# Patient Record
Sex: Male | Born: 1945 | Race: White | Hispanic: No | Marital: Married | Smoking: Never smoker
Health system: Southern US, Community
[De-identification: ages and names within clinical notes are randomized; demographics above are authoritative.]

## PROBLEM LIST (undated history)

## (undated) DIAGNOSIS — K219 Gastro-esophageal reflux disease without esophagitis: Secondary | ICD-10-CM

## (undated) DIAGNOSIS — N35919 Unspecified urethral stricture, male, unspecified site: Secondary | ICD-10-CM

## (undated) DIAGNOSIS — Z8719 Personal history of other diseases of the digestive system: Secondary | ICD-10-CM

## (undated) DIAGNOSIS — F419 Anxiety disorder, unspecified: Secondary | ICD-10-CM

## (undated) DIAGNOSIS — Z973 Presence of spectacles and contact lenses: Secondary | ICD-10-CM

## (undated) DIAGNOSIS — J302 Other seasonal allergic rhinitis: Secondary | ICD-10-CM

## (undated) DIAGNOSIS — Z8711 Personal history of peptic ulcer disease: Secondary | ICD-10-CM

## (undated) HISTORY — PX: KNEE ARTHROSCOPY: SHX127

---

## 2000-05-26 ENCOUNTER — Encounter: Payer: Self-pay | Admitting: Emergency Medicine

## 2000-05-26 ENCOUNTER — Inpatient Hospital Stay (HOSPITAL_COMMUNITY): Admission: EM | Admit: 2000-05-26 | Discharge: 2000-05-29 | Payer: Self-pay | Admitting: Emergency Medicine

## 2000-10-23 ENCOUNTER — Encounter: Payer: Self-pay | Admitting: Emergency Medicine

## 2000-10-23 ENCOUNTER — Emergency Department (HOSPITAL_COMMUNITY): Admission: EM | Admit: 2000-10-23 | Discharge: 2000-10-23 | Payer: Self-pay | Admitting: Emergency Medicine

## 2000-10-23 ENCOUNTER — Encounter: Payer: Self-pay | Admitting: Neurosurgery

## 2003-04-07 ENCOUNTER — Emergency Department (HOSPITAL_COMMUNITY): Admission: EM | Admit: 2003-04-07 | Discharge: 2003-04-07 | Payer: Self-pay | Admitting: Emergency Medicine

## 2003-04-07 ENCOUNTER — Encounter: Payer: Self-pay | Admitting: Emergency Medicine

## 2013-06-23 ENCOUNTER — Encounter (HOSPITAL_COMMUNITY): Payer: Self-pay

## 2013-06-23 ENCOUNTER — Emergency Department (HOSPITAL_COMMUNITY)
Admission: EM | Admit: 2013-06-23 | Discharge: 2013-06-23 | Disposition: A | Discharge status: Home / Self Care | Payer: Managed Care, Other (non HMO) | Attending: Emergency Medicine | Admitting: Emergency Medicine

## 2013-06-23 DIAGNOSIS — S60559A Superficial foreign body of unspecified hand, initial encounter: Secondary | ICD-10-CM | POA: Insufficient documentation

## 2013-06-23 DIAGNOSIS — Z79899 Other long term (current) drug therapy: Secondary | ICD-10-CM | POA: Insufficient documentation

## 2013-06-23 DIAGNOSIS — Z23 Encounter for immunization: Secondary | ICD-10-CM | POA: Insufficient documentation

## 2013-06-23 DIAGNOSIS — S60551A Superficial foreign body of right hand, initial encounter: Secondary | ICD-10-CM

## 2013-06-23 DIAGNOSIS — T148XXA Other injury of unspecified body region, initial encounter: Secondary | ICD-10-CM

## 2013-06-23 DIAGNOSIS — Y939 Activity, unspecified: Secondary | ICD-10-CM | POA: Insufficient documentation

## 2013-06-23 DIAGNOSIS — Y929 Unspecified place or not applicable: Secondary | ICD-10-CM | POA: Insufficient documentation

## 2013-06-23 DIAGNOSIS — X58XXXA Exposure to other specified factors, initial encounter: Secondary | ICD-10-CM | POA: Insufficient documentation

## 2013-06-23 MED ORDER — CLINDAMYCIN HCL 150 MG PO CAPS: 450.0000 mg | ORAL_CAPSULE | Freq: Three times a day (TID) | ORAL | Status: DC

## 2013-06-23 MED ORDER — CLINDAMYCIN HCL 300 MG PO CAPS
600.0000 mg | ORAL_CAPSULE | Freq: Once | ORAL | Status: AC
  Administered 2013-06-23: 600 mg via ORAL
  Filled 2013-06-23 (×2): qty 1

## 2013-06-23 MED ORDER — TETANUS-DIPHTH-ACELL PERTUSSIS 5-2.5-18.5 LF-MCG/0.5 IM SUSP
0.5000 mL | Freq: Once | INTRAMUSCULAR | Status: AC
  Administered 2013-06-23: 0.5 mL via INTRAMUSCULAR
  Filled 2013-06-23: qty 0.5

## 2013-06-23 NOTE — ED Provider Notes (Signed)
History  This chart was scribed for non-physician practitioner working with Douglas Munch, MD by Douglas Taylor, ED scribe. This patient was seen in room WTR8/WTR8 and the patient's care was started at 4:20 PM.  CSN: [id number] Arrival date & time 06/23/13  1534   Chief Complaint  Patient presents with  . Foreign Body in Skin   The history is provided by the patient and medical records. No language interpreter was used.    HPI Comments: Douglas Taylor is a 67 y.o. male who presents to the Emergency Department complaining of a splinter in his right hand that happened yesterday. He states he got some of it out but there is still some remaining. Pt states he has not done anything to it other than trying to pull it out. Pt states it has gotten a little red. Pt states he went to an Urgent Care in [facility] yesterday and they couldn't do anything about it. Pt states he has not taken any antibiotics. Nothing makes it better or worse. He denies fever, chills, nausea, vomiting.    History reviewed. No pertinent past medical history. History reviewed. No pertinent past surgical history. History reviewed. No pertinent family history. History  Substance Use Topics  . Smoking status: Not on file  . Smokeless tobacco: Not on file  . Alcohol Use: Not on file    Review of Systems  Constitutional: Negative for fever.  Skin: Positive for color change and wound.       Splinter in hand  Neurological: Negative for weakness and numbness.  All other systems reviewed and are negative.    Allergies  Review of patient's allergies indicates no known allergies.  Home Medications   Current Outpatient Rx  Name  Route  Sig  Dispense  Refill  . ALPRAZolam (XANAX XR) 1 MG 24 hr tablet   Oral   Take 1.5 mg by mouth every morning.         . clindamycin (CLEOCIN) 150 MG capsule   Oral   Take 3 capsules (450 mg total) by mouth 3 (three) times daily.   90 capsule   0     BP 133/78  Pulse  83  Temp(Src) 98.4 F (36.9 C) (Oral)  Resp 15  SpO2 99%  Physical Exam  Nursing note and vitals reviewed. Constitutional: He is oriented to person, place, and time. He appears well-developed and well-nourished. No distress.  Awake, alert, nontoxic appearance  HENT:  Head: Normocephalic and atraumatic.  Mouth/Throat: Oropharynx is clear and moist. No oropharyngeal exudate.  Eyes: Conjunctivae are normal. No scleral icterus.  Neck: Normal range of motion. Neck supple.  Cardiovascular: Normal rate, regular rhythm, S1 normal, S2 normal, normal heart sounds and intact distal pulses.   No murmur heard. Pulses:      Radial pulses are 2+ on the right side, and 2+ on the left side.  Capillary refill < 3 sec Pulses intact  Pulmonary/Chest: Effort normal and breath sounds normal. No respiratory distress. He has no wheezes.  Musculoskeletal: Normal range of motion. He exhibits no edema.  Full ROM of all joints in the RUE  Neurological: He is alert and oriented to person, place, and time.  Speech is clear and goal oriented Moves extremities without ataxia Sensation intact Strength 5 out of 5 in the right upper M.D. including strong grip strength  Skin: Skin is warm and dry. He is not diaphoretic.  Dorsal side of right hand with 3 cm area of erythema and mild  induration with visible foreign body.  Mild streaking of the erythema into the dorsal aspect of the wrist Nontender to palpation.   Psychiatric: He has a normal mood and affect.    ED Course  FOREIGN BODY REMOVAL Date/Time: 06/23/2013 5:01 PM Performed by: Douglas Taylor Authorized by: Douglas Taylor Consent: Verbal consent obtained. Risks and benefits: risks, benefits and alternatives were discussed Consent given by: patient Patient understanding: patient states understanding of the procedure being performed Patient consent: the patient's understanding of the procedure matches consent given Procedure consent:  procedure consent matches procedure scheduled Relevant documents: relevant documents present and verified Site marked: the operative site was marked Required items: required blood products, implants, devices, and special equipment available Patient identity confirmed: verbally with patient and arm band Time out: Immediately prior to procedure a "time out" was called to verify the correct patient, procedure, equipment, support staff and site/side marked as required. Body area: skin General location: upper extremity Location details: right hand Anesthesia: local infiltration Local anesthetic: lidocaine 1% without epinephrine Anesthetic total: 3 ml Patient sedated: no Patient restrained: no Patient cooperative: yes Localization method: visualized Removal mechanism: scalpel and hemostat Dressing: antibiotic ointment and dressing applied Tendon involvement: none Depth: subcutaneous 1 objects recovered. Objects recovered: 2cm wood splinter Post-procedure assessment: foreign body removed Patient tolerance: Patient tolerated the procedure well with no immediate complications.   (including critical care time)  DIAGNOSTIC STUDIES: Oxygen Saturation is 99% on RA, normal by my interpretation.    COORDINATION OF CARE: 4:28 PM-Discussed treatment plan which includes removal of splinter with pt at bedside and pt agreed to plan. Advised pt to follow up with PCP. Also advised pt to come back to ED but the redness goes further up his arm.   Labs Reviewed - No data to display No results found. 1. Splinter in skin   2. Foreign body, hand, superficial, right, initial encounter     MDM  Douglas Taylor presents with splinter in the dorsum of his right hand.  Splinter removed after numbing with scalpel and hemostat. No complications. No evidence of persistent foreign body. Patient tetanus updated, given one dose of antibiotics here in the department. Patient discharged with oral antibiotics.  Discussed at length reasons to return to the emergency department including increasing redness , warmth or development of fever. Recommend wound check in 2 days by primary care physician  I have also discussed reasons to return immediately to the ER.  Patient expresses understanding and agrees with plan.  Dr. Jeraldine Loots was consulted, evaluated this patient with me and agrees with the plan.    I personally performed the services described in this documentation, which was scribed in my presence. The recorded information has been reviewed and is accurate.    Dahlia Client Waymond Meador, PA-C 06/23/13 1705

## 2013-06-23 NOTE — ED Notes (Signed)
He states he got a splinter in his right hand yesterday.  This area on distal post. Right hand is surrounded by erythema, with a streak of erythema beginning to travel up his arm.   A bit of the splinter is visible post. Right hand.

## 2013-06-24 NOTE — ED Provider Notes (Signed)
  This was a shared visit with a mid-level provided (NP or PA).  Throughout the patient's course I was available for consultation/collaboration.  I saw the ECG (if appropriate), relevant labs and studies - I agree with the interpretation.  On my exam the patient was in no distress.  There was a notable foreign body in the dorsum of his hand      Gerhard Munch, MD 06/24/13 [phone]

## 2015-03-30 ENCOUNTER — Ambulatory Visit: Payer: Self-pay | Admitting: General Surgery

## 2015-03-30 NOTE — H&P (Signed)
History of Present Illness Ralene Ok MD; 02/10/2015 2:15 PM) Patient words: RIH.  The patient is a 69 year old male who presents with an inguinal hernia. Patient is a 69 year old male who is referred by Dr. Virgina Jock for an evaluation of a right inguinal hernia. Patient states that approximately 3 weeks ago this began with pain. He states that this was reduced in the office and since then has had on and off pain. 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    Other Problems Elbert Ewings, CMA; 02/10/2015 2:02 PM) Anxiety Disorder  Past Surgical History Elbert Ewings, [place]; 02/10/2015 2:02 PM) Vasectomy  Diagnostic Studies History Elbert Ewings, [place]; 02/10/2015 2:02 PM) Colonoscopy 5-10 years ago  Allergies Elbert Ewings, CMA; 02/10/2015 2:03 PM) No Known Drug Allergies03/12/2014  Medication History Elbert Ewings, CMA; 02/10/2015 2:03 PM) ALPRAZolam (0.5MG  Tablet, Oral) Active. Medications Reconciled  Social History Elbert Ewings, [place]; 02/10/2015 2:02 PM) Alcohol use Remotely quit alcohol use. Illicit drug use Remotely quit drug use. Tobacco use Never smoker.  Family History Elbert Ewings, [place]; 02/10/2015 2:02 PM) Arthritis Father. Heart disease in male family member before age 19  Review of Systems Elbert Ewings CMA; 02/10/2015 2:02 PM) General Not Present- Appetite Loss, Chills, Fatigue, Fever, Night Sweats, Weight Gain and Weight Loss. Skin Not Present- Change in Wart/Mole, Dryness, Hives, Jaundice, New Lesions, Non-Healing Wounds, Rash and Ulcer. HEENT Not Present- Earache, Hearing Loss, Hoarseness, Nose Bleed, Oral Ulcers, Ringing in the Ears, Seasonal Allergies, Sinus Pain, Sore Throat, Visual Disturbances, Wears glasses/contact lenses and Yellow Eyes. Respiratory Not Present- Bloody sputum, Chronic Cough, Difficulty Breathing, Snoring and Wheezing. Gastrointestinal Not Present- Abdominal Pain, Bloating, Bloody Stool, Change  in Bowel Habits, Chronic diarrhea, Constipation, Difficulty Swallowing, Excessive gas, Gets full quickly at meals, Hemorrhoids, Indigestion, Nausea, Rectal Pain and Vomiting. Male Genitourinary Not Present- Blood in Urine, Change in Urinary Stream, Frequency, Impotence, Nocturia, Painful Urination, Urgency and Urine Leakage.   Vitals Elbert Ewings CMA; 02/10/2015 2:04 PM) 02/10/2015 2:03 PM Weight: 160 lb Height: 67in Body Surface Area: 1.85 m Body Mass Index: 25.06 kg/m Temp.: 96.15F(Temporal)  Pulse: 79 (Regular)  Resp.: 16 (Unlabored)  BP: 128/70 (Sitting, Left Arm, Standard)    Physical Exam Ralene Ok MD; 02/10/2015 2:16 PM) General Mental Status-Alert. General Appearance-Consistent with stated age. Hydration-Well hydrated. Voice-Normal.  Head and Neck Head-normocephalic, atraumatic with no lesions or palpable masses. Trachea-midline. Thyroid Gland Characteristics - normal size and consistency.  Chest and Lung Exam Chest and lung exam reveals -quiet, even and easy respiratory effort with no use of accessory muscles and on auscultation, normal breath sounds, no adventitious sounds and normal vocal resonance. Inspection Chest Wall - Normal. Back - normal.  Cardiovascular Cardiovascular examination reveals -normal heart sounds, regular rate and rhythm with no murmurs and normal pedal pulses bilaterally.  Abdomen Inspection Skin - Scar - no surgical scars. Hernias - Inguinal hernia - Right - Incarcerated and Reducible. Palpation/Percussion Normal exam - Soft, Non Tender, No Rebound tenderness, No Rigidity (guarding) and No hepatosplenomegaly. Auscultation Normal exam - Bowel sounds normal.    Assessment & Plan Ralene Ok MD; 02/10/2015 2:17 PM) RIGHT INGUINAL HERNIA (550.90  K40.90) Impression: 69 year old male with a right inguinal hernia, likely indirect. I discussed with the patient laparoscopic surgery repair. The patient will  like to wait until his employment allows time for him to recover. When he is ready for surgery he will call us back we can schedule  him at that time. 1. The patient will like to proceed to the operating room for laparoscopic right inguinal hernia repair with mesh. 2. All risks and benefits were discussed with the patient to generally include, but not limited to: infection, bleeding, damage to surrounding structures, acute and chronic nerve pain, and recurrence. Alternatives were offered and described. All questions were answered and the patient voiced understanding of the procedure and wishes to proceed at this point with hernia repair.

## 2015-03-30 NOTE — Progress Notes (Signed)
Please put orders in Epic surgery 04-10-15 pre op 04-02-15 Thanks

## 2015-04-01 NOTE — Patient Instructions (Addendum)
Douglas Taylor  04/01/2015   Your procedure is scheduled on: Friday 04/10/15  Report to [facility] Main  Entrance and follow signs to               [place] at 05:30 AM.  Call this number if you have problems the morning of surgery [phone]   Remember:  Do not eat food or drink liquids :After Midnight.    Take these medicines the morning of surgery with A SIP OF WATER: Xanax                               You may not have any metal on your body including hair pins and              piercings  Do not wear jewelry, make-up, lotions, powders or perfumes.             Do not wear nail polish.  Do not shave  48 hours prior to surgery.              Men may shave face and neck.   Do not bring valuables to the hospital. [name].  Contacts, dentures or bridgework may not be worn into surgery.  Leave suitcase in the car. After surgery it may be brought to your room.     Patients discharged the day of surgery will not be allowed to drive home.  Name and phone number of your driver: Douglas Taylor [phone]  Special Instructions: N/A              Please read over the following fact sheets you were given: _____________________________________________________________________             [facility] - Preparing for Surgery Before surgery, you can play an important role.  Because skin is not sterile, your skin needs to be as free of germs as possible.  You can reduce the number of germs on your skin by washing with CHG (chlorahexidine gluconate) soap before surgery.  CHG is an antiseptic cleaner which kills germs and bonds with the skin to continue killing germs even after washing. Please DO NOT use if you have an allergy to CHG or antibacterial soaps.  If your skin becomes reddened/irritated stop using the CHG and inform your nurse when you arrive at Short Stay. Do not shave (including legs and underarms) for at least  48 hours prior to the first CHG shower.  You may shave your face/neck. Please follow these instructions carefully:  1.  Shower with CHG Soap the night before surgery and the  morning of Surgery.  2.  If you choose to wash your hair, wash your hair first as usual with your  normal  shampoo.  3.  After you shampoo, rinse your hair and body thoroughly to remove the  shampoo.                           4.  Use CHG as you would any other liquid soap.  You can apply chg directly  to the skin and wash  Gently with a scrungie or clean washcloth.  5.  Apply the CHG Soap to your body ONLY FROM THE NECK DOWN.   Do not use on face/ open                           Wound or open sores. Avoid contact with eyes, ears mouth and genitals (private parts).                       Wash face,  Genitals (private parts) with your normal soap.             6.  Wash thoroughly, paying special attention to the area where your surgery  will be performed.  7.  Thoroughly rinse your body with warm water from the neck down.  8.  DO NOT shower/wash with your normal soap after using and rinsing off  the CHG Soap.                9.  Pat yourself dry with a clean towel.            10.  Wear clean pajamas.            11.  Place clean sheets on your bed the night of your first shower and do not  sleep with pets. Day of Surgery : Do not apply any lotions/deodorants the morning of surgery.  Please wear clean clothes to the hospital/surgery center.  FAILURE TO FOLLOW THESE INSTRUCTIONS MAY RESULT IN THE CANCELLATION OF YOUR SURGERY PATIENT SIGNATURE_________________________________  NURSE SIGNATURE__________________________________  ________________________________________________________________________

## 2015-04-02 ENCOUNTER — Encounter (HOSPITAL_COMMUNITY): Payer: Self-pay

## 2015-04-02 ENCOUNTER — Encounter (HOSPITAL_COMMUNITY)
Admission: RE | Admit: 2015-04-02 | Discharge: 2015-04-02 | Disposition: A | Discharge status: Home / Self Care | Payer: Managed Care, Other (non HMO) | Source: Ambulatory Visit | Attending: General Surgery | Admitting: General Surgery

## 2015-04-02 DIAGNOSIS — Z01818 Encounter for other preprocedural examination: Secondary | ICD-10-CM | POA: Diagnosis present

## 2015-04-02 HISTORY — DX: Anxiety disorder, unspecified: F41.9

## 2015-04-02 LAB — CBC
HEMATOCRIT: 42.5 % (ref 39.0–52.0)
HEMOGLOBIN: 14.4 g/dL (ref 13.0–17.0)
MCH: 30.8 pg (ref 26.0–34.0)
MCHC: 33.9 g/dL (ref 30.0–36.0)
MCV: 91 fL (ref 78.0–100.0)
Platelets: 253 10*3/uL (ref 150–400)
RBC: 4.67 MIL/uL (ref 4.22–5.81)
RDW: 13.3 % (ref 11.5–15.5)
WBC: 6.3 10*3/uL (ref 4.0–10.5)

## 2015-04-08 NOTE — Anesthesia Preprocedure Evaluation (Addendum)
Anesthesia Evaluation  Patient identified by MRN, date of birth, ID band Patient awake    Reviewed: Allergy & Precautions, NPO status , Patient's Chart, lab work & pertinent test results, reviewed documented beta blocker date and time   Airway Mallampati: II   Neck ROM: Full    Dental  (+) Teeth Intact, Dental Advisory Given   Pulmonary  breath sounds clear to auscultation        Cardiovascular negative cardio ROS  Rhythm:Regular     Neuro/Psych Anxiety negative neurological ROS     GI/Hepatic negative GI ROS, Neg liver ROS,   Endo/Other  negative endocrine ROS  Renal/GU negative Renal ROS     Musculoskeletal   Abdominal (+)  Abdomen: soft.    Peds  Hematology 14/42   Anesthesia Other Findings   Reproductive/Obstetrics                            Anesthesia Physical Anesthesia Plan  ASA: I  Anesthesia Plan: General   Post-op Pain Management:    Induction: Intravenous  Airway Management Planned: Oral ETT  Additional Equipment:   Intra-op Plan:   Post-operative Plan: Extubation in OR  Informed Consent: I have reviewed the patients History and Physical, chart, labs and discussed the procedure including the risks, benefits and alternatives for the proposed anesthesia with the patient or authorized representative who has indicated his/her understanding and acceptance.     Plan Discussed with:   Anesthesia Plan Comments:        Anesthesia Quick Evaluation

## 2015-04-10 ENCOUNTER — Ambulatory Visit (HOSPITAL_COMMUNITY): Payer: Managed Care, Other (non HMO) | Admitting: Anesthesiology

## 2015-04-10 ENCOUNTER — Encounter (HOSPITAL_COMMUNITY)
Admission: RE | Disposition: A | Discharge status: Home / Self Care | Payer: Self-pay | Source: Ambulatory Visit | Attending: General Surgery

## 2015-04-10 ENCOUNTER — Ambulatory Visit (HOSPITAL_COMMUNITY)
Admission: RE | Admit: 2015-04-10 | Discharge: 2015-04-10 | Disposition: A | Discharge status: Home / Self Care | Payer: Managed Care, Other (non HMO) | Source: Ambulatory Visit | Attending: General Surgery | Admitting: General Surgery

## 2015-04-10 ENCOUNTER — Encounter (HOSPITAL_COMMUNITY): Payer: Self-pay | Admitting: *Deleted

## 2015-04-10 DIAGNOSIS — Z9852 Vasectomy status: Secondary | ICD-10-CM | POA: Insufficient documentation

## 2015-04-10 DIAGNOSIS — F419 Anxiety disorder, unspecified: Secondary | ICD-10-CM | POA: Diagnosis not present

## 2015-04-10 DIAGNOSIS — K4090 Unilateral inguinal hernia, without obstruction or gangrene, not specified as recurrent: Secondary | ICD-10-CM | POA: Diagnosis present

## 2015-04-10 HISTORY — PX: INGUINAL HERNIA REPAIR: SHX194

## 2015-04-10 HISTORY — PX: INSERTION OF MESH: SHX5868

## 2015-04-10 SURGERY — REPAIR, HERNIA, INGUINAL, LAPAROSCOPIC
Anesthesia: General | Site: Groin | Laterality: Right

## 2015-04-10 MED ORDER — GLYCOPYRROLATE 0.2 MG/ML IJ SOLN
INTRAMUSCULAR | Status: DC | PRN
  Administered 2015-04-10: 0.6 mg via INTRAVENOUS

## 2015-04-10 MED ORDER — FENTANYL CITRATE (PF) 100 MCG/2ML IJ SOLN
INTRAMUSCULAR | Status: DC | PRN
  Administered 2015-04-10: 50 ug via INTRAVENOUS
  Administered 2015-04-10: 100 ug via INTRAVENOUS

## 2015-04-10 MED ORDER — CEFAZOLIN SODIUM-DEXTROSE 2-3 GM-% IV SOLR
2.0000 g | INTRAVENOUS | Status: AC
  Administered 2015-04-10: 2 g via INTRAVENOUS

## 2015-04-10 MED ORDER — LACTATED RINGERS IV SOLN: INTRAVENOUS | Status: DC

## 2015-04-10 MED ORDER — OXYCODONE HCL 5 MG PO TABS
5.0000 mg | ORAL_TABLET | ORAL | Status: DC | PRN
  Administered 2015-04-10: 5 mg via ORAL
  Filled 2015-04-10: qty 1

## 2015-04-10 MED ORDER — MIDAZOLAM HCL 2 MG/2ML IJ SOLN
INTRAMUSCULAR | Status: AC
  Filled 2015-04-10: qty 2

## 2015-04-10 MED ORDER — CHLORHEXIDINE GLUCONATE 4 % EX LIQD: 1.0000 "application " | Freq: Once | CUTANEOUS | Status: DC

## 2015-04-10 MED ORDER — CISATRACURIUM BESYLATE 20 MG/10ML IV SOLN
INTRAVENOUS | Status: AC
  Filled 2015-04-10: qty 10

## 2015-04-10 MED ORDER — DEXAMETHASONE SODIUM PHOSPHATE 10 MG/ML IJ SOLN
INTRAMUSCULAR | Status: DC | PRN
  Administered 2015-04-10: 10 mg via INTRAVENOUS

## 2015-04-10 MED ORDER — ONDANSETRON HCL 4 MG/2ML IJ SOLN
INTRAMUSCULAR | Status: AC
  Filled 2015-04-10: qty 2

## 2015-04-10 MED ORDER — CISATRACURIUM BESYLATE (PF) 10 MG/5ML IV SOLN
INTRAVENOUS | Status: DC | PRN
  Administered 2015-04-10: 10 mg via INTRAVENOUS
  Administered 2015-04-10: 2 mg via INTRAVENOUS

## 2015-04-10 MED ORDER — METOCLOPRAMIDE HCL 5 MG/ML IJ SOLN
INTRAMUSCULAR | Status: AC
  Filled 2015-04-10: qty 2

## 2015-04-10 MED ORDER — 0.9 % SODIUM CHLORIDE (POUR BTL) OPTIME
TOPICAL | Status: DC | PRN
  Administered 2015-04-10: 1000 mL

## 2015-04-10 MED ORDER — DEXAMETHASONE SODIUM PHOSPHATE 10 MG/ML IJ SOLN
INTRAMUSCULAR | Status: AC
  Filled 2015-04-10: qty 1

## 2015-04-10 MED ORDER — SODIUM CHLORIDE 0.9 % IJ SOLN: 3.0000 mL | INTRAMUSCULAR | Status: DC | PRN

## 2015-04-10 MED ORDER — PROMETHAZINE HCL 25 MG/ML IJ SOLN: 6.2500 mg | INTRAMUSCULAR | Status: DC | PRN

## 2015-04-10 MED ORDER — PROPOFOL 10 MG/ML IV BOLUS
INTRAVENOUS | Status: AC
  Filled 2015-04-10: qty 20

## 2015-04-10 MED ORDER — GLYCOPYRROLATE 0.2 MG/ML IJ SOLN
INTRAMUSCULAR | Status: AC
  Filled 2015-04-10: qty 3

## 2015-04-10 MED ORDER — SODIUM CHLORIDE 0.9 % IV SOLN: 250.0000 mL | INTRAVENOUS | Status: DC | PRN

## 2015-04-10 MED ORDER — ACETAMINOPHEN 325 MG PO TABS: 650.0000 mg | ORAL_TABLET | ORAL | Status: DC | PRN

## 2015-04-10 MED ORDER — NEOSTIGMINE METHYLSULFATE 10 MG/10ML IV SOLN
INTRAVENOUS | Status: DC | PRN
  Administered 2015-04-10: 4 mg via INTRAVENOUS

## 2015-04-10 MED ORDER — MIDAZOLAM HCL 5 MG/5ML IJ SOLN
INTRAMUSCULAR | Status: DC | PRN
Start: 2015-04-10 — End: 2015-04-10
  Administered 2015-04-10: 2 mg via INTRAVENOUS

## 2015-04-10 MED ORDER — OXYCODONE-ACETAMINOPHEN 5-325 MG PO TABS: 1.0000 | ORAL_TABLET | ORAL | Status: DC | PRN

## 2015-04-10 MED ORDER — BUPIVACAINE-EPINEPHRINE 0.25% -1:200000 IJ SOLN
INTRAMUSCULAR | Status: DC | PRN
  Administered 2015-04-10: 15 mL

## 2015-04-10 MED ORDER — FENTANYL CITRATE (PF) 250 MCG/5ML IJ SOLN
INTRAMUSCULAR | Status: AC
  Filled 2015-04-10: qty 5

## 2015-04-10 MED ORDER — SODIUM CHLORIDE 0.9 % IJ SOLN: 3.0000 mL | Freq: Two times a day (BID) | INTRAMUSCULAR | Status: DC

## 2015-04-10 MED ORDER — FENTANYL CITRATE (PF) 100 MCG/2ML IJ SOLN
25.0000 ug | INTRAMUSCULAR | Status: DC | PRN
  Administered 2015-04-10 (×2): 50 ug via INTRAVENOUS

## 2015-04-10 MED ORDER — METOCLOPRAMIDE HCL 5 MG/ML IJ SOLN
INTRAMUSCULAR | Status: DC | PRN
  Administered 2015-04-10: 10 mg via INTRAVENOUS

## 2015-04-10 MED ORDER — ACETAMINOPHEN 650 MG RE SUPP
650.0000 mg | RECTAL | Status: DC | PRN
  Filled 2015-04-10: qty 1

## 2015-04-10 MED ORDER — ONDANSETRON HCL 4 MG/2ML IJ SOLN
INTRAMUSCULAR | Status: DC | PRN
  Administered 2015-04-10: 4 mg via INTRAVENOUS

## 2015-04-10 MED ORDER — MEPERIDINE HCL 50 MG/ML IJ SOLN: 6.2500 mg | INTRAMUSCULAR | Status: DC | PRN

## 2015-04-10 MED ORDER — LACTATED RINGERS IV SOLN
INTRAVENOUS | Status: DC | PRN
  Administered 2015-04-10: 07:00:00 via INTRAVENOUS

## 2015-04-10 MED ORDER — FENTANYL CITRATE (PF) 100 MCG/2ML IJ SOLN
INTRAMUSCULAR | Status: AC
  Filled 2015-04-10: qty 2

## 2015-04-10 MED ORDER — CEFAZOLIN SODIUM-DEXTROSE 2-3 GM-% IV SOLR
INTRAVENOUS | Status: AC
  Filled 2015-04-10: qty 50

## 2015-04-10 MED ORDER — PROPOFOL 10 MG/ML IV BOLUS
INTRAVENOUS | Status: DC | PRN
Start: 2015-04-10 — End: 2015-04-10
  Administered 2015-04-10: 140 mg via INTRAVENOUS

## 2015-04-10 SURGICAL SUPPLY — 34 items
BAG URINE DRAINAGE (UROLOGICAL SUPPLIES) ×3 IMPLANT
BENZOIN TINCTURE PRP APPL 2/3 (GAUZE/BANDAGES/DRESSINGS) ×3 IMPLANT
CABLE HIGH FREQUENCY MONO STRZ (ELECTRODE) IMPLANT
CATH FOLEY 3WAY 30CC 16FR (CATHETERS) ×3 IMPLANT
CHLORAPREP W/TINT 26ML (MISCELLANEOUS) ×3 IMPLANT
CLOSURE WOUND 1/2 X4 (GAUZE/BANDAGES/DRESSINGS) ×1
DECANTER SPIKE VIAL GLASS SM (MISCELLANEOUS) IMPLANT
DRAPE LAPAROSCOPIC ABDOMINAL (DRAPES) ×3 IMPLANT
ELECT REM PT RETURN 9FT ADLT (ELECTROSURGICAL) ×3
ELECTRODE REM PT RTRN 9FT ADLT (ELECTROSURGICAL) ×1 IMPLANT
GAUZE SPONGE 2X2 8PLY STRL LF (GAUZE/BANDAGES/DRESSINGS) ×1 IMPLANT
GLOVE BIO SURGEON STRL SZ7.5 (GLOVE) ×3 IMPLANT
GLOVE BIOGEL PI IND STRL 7.0 (GLOVE) ×4 IMPLANT
GLOVE BIOGEL PI INDICATOR 7.0 (GLOVE) ×8
GLOVE SURG SS PI 6.5 STRL IVOR (GLOVE) ×6 IMPLANT
GOWN STRL REUS W/TWL XL LVL3 (GOWN DISPOSABLE) ×9 IMPLANT
KIT BASIN OR (CUSTOM PROCEDURE TRAY) ×3 IMPLANT
MESH 3DMAX 4X6 RT LRG (Mesh General) ×3 IMPLANT
RELOAD STAPLE HERNIA 4.0 BLUE (INSTRUMENTS) ×3 IMPLANT
SCISSORS LAP 5X35 DISP (ENDOMECHANICALS) IMPLANT
SET IRRIG TUBING LAPAROSCOPIC (IRRIGATION / IRRIGATOR) IMPLANT
SET IRRIG Y TYPE TUR BLADDER L (SET/KITS/TRAYS/PACK) ×3 IMPLANT
SOLUTION ANTI FOG 6CC (MISCELLANEOUS) IMPLANT
SPONGE GAUZE 2X2 STER 10/PKG (GAUZE/BANDAGES/DRESSINGS) ×2
STAPLER HERNIA 12 8.5 360D (INSTRUMENTS) ×3 IMPLANT
STRIP CLOSURE SKIN 1/2X4 (GAUZE/BANDAGES/DRESSINGS) ×2 IMPLANT
SUT MNCRL AB 4-0 PS2 18 (SUTURE) ×3 IMPLANT
TAPE CLOTH SURG 4X10 WHT LF (GAUZE/BANDAGES/DRESSINGS) ×3 IMPLANT
TOWEL OR 17X26 10 PK STRL BLUE (TOWEL DISPOSABLE) ×3 IMPLANT
TOWEL OR NON WOVEN STRL DISP B (DISPOSABLE) ×3 IMPLANT
TRAY FOLEY W/METER SILVER 14FR (SET/KITS/TRAYS/PACK) ×3 IMPLANT
TRAY LAPAROSCOPIC (CUSTOM PROCEDURE TRAY) ×3 IMPLANT
TROCAR CANNULA W/PORT DUAL 5MM (MISCELLANEOUS) ×3 IMPLANT
TROCAR XCEL 12X100 BLDLESS (ENDOMECHANICALS) ×3 IMPLANT

## 2015-04-10 NOTE — Discharge Instructions (Signed)
[facility] _______[facility] ° °INGUINAL HERNIA REPAIR: POST OP INSTRUCTIONS ° °Always review your discharge instruction sheet given to you by the facility where your surgery was performed. °IF YOU HAVE DISABILITY OR FAMILY LEAVE FORMS, YOU MUST BRING THEM TO THE OFFICE FOR PROCESSING.   °DO NOT GIVE THEM TO YOUR DOCTOR. ° °1. A  prescription for pain medication may be given to you upon discharge.  Take your pain medication as prescribed, if needed.  If narcotic pain medicine is not needed, then you may take acetaminophen (Tylenol) or ibuprofen (Advil) as needed. °2. Take your usually prescribed medications unless otherwise directed. °3. If you need a refill on your pain medication, please contact your pharmacy.  They will contact our office to request authorization. Prescriptions will not be filled after 5 pm or on week-ends. °4. You should follow a light diet the first 24 hours after arrival home, such as soup and crackers, etc.  Be sure to include lots of fluids daily.  Resume your normal diet the day after surgery. °5. Most patients will experience some swelling and bruising around the umbilicus or in the groin and scrotum.  Ice packs and reclining will help.  Swelling and bruising can take several days to resolve.  °6. It is common to experience some constipation if taking pain medication after surgery.  Increasing fluid intake and taking a stool softener (such as Colace) will usually help or prevent this problem from occurring.  A mild laxative (Milk of Magnesia or Miralax) should be taken according to package directions if there are no bowel movements after 48 hours. °7. Unless discharge instructions indicate otherwise, you may remove your bandages 24-48 hours after surgery, and you may shower at that time.  You may have steri-strips (small skin tapes) in place directly over the incision.  These strips should be left on the skin for 7-10 days.  If your surgeon used skin glue on the incision, you  may shower in 24 hours.  The glue will flake off over the next 2-3 weeks.  Any sutures or staples will be removed at the office during your follow-up visit. °8. ACTIVITIES:  You may resume regular (light) daily activities beginning the next day--such as daily self-care, walking, climbing stairs--gradually increasing activities as tolerated.  You may have sexual intercourse when it is comfortable.  Refrain from any heavy lifting or straining until approved by your doctor. °a. You may drive when you are no longer taking prescription pain medication, you can comfortably wear a seatbelt, and you can safely maneuver your car and apply brakes. °b. RETURN TO WORK:  __________________________________________________________ °9. You should see your doctor in the office for a follow-up appointment approximately 2-3 weeks after your surgery.  Make sure that you call for this appointment within a day or two after you arrive home to insure a convenient appointment time. °10. OTHER INSTRUCTIONS:  __________________________________________________________________________________________________________________________________________________________________________________________  °WHEN TO CALL YOUR DOCTOR: °1. Fever over 101.0 °2. Inability to urinate °3. Nausea and/or vomiting °4. Extreme swelling or bruising °5. Continued bleeding from incision. °6. Increased pain, redness, or drainage from the incision ° °The clinic staff is available to answer your questions during regular business hours.  Please don’t hesitate to call and ask to speak to one of the nurses for clinical concerns.  If you have a medical emergency, go to the nearest emergency room or call 911.  A surgeon from [facility] is always on call at the hospital ° ° °[address], [address], [place], [place]  [address] ? ° [address] [address], [place], [place]   [address] °[phone] ? [phone] ? FAX [phone] °Web site:  [email or web address] ° °

## 2015-04-10 NOTE — Interval H&P Note (Signed)
History and Physical Interval Note:  04/10/2015 7:20 AM  Douglas Taylor  has presented today for surgery, with the diagnosis of right inguinal hernia  The various methods of treatment have been discussed with the patient and family. After consideration of risks, benefits and other options for treatment, the patient has consented to  Procedure(s): LAPAROSCOPIC INGUINAL HERNIA REPAIR WITH MESH (Right) INSERTION OF MESH (Right) as a surgical intervention .  The patient's history has been reviewed, patient examined, no change in status, stable for surgery.  I have reviewed the patient's chart and labs.  Questions were answered to the patient's satisfaction.     Rosario Jacks., Anne Hahn

## 2015-04-10 NOTE — Transfer of Care (Signed)
Immediate Anesthesia Transfer of Care Note  Patient: Douglas Taylor  Procedure(s) Performed: Procedure(s): LAPAROSCOPIC INGUINAL HERNIA REPAIR WITH MESH (Right) INSERTION OF MESH (Right)  Patient Location: PACU  Anesthesia Type:General  Level of Consciousness: awake, sedated and patient cooperative  Airway & Oxygen Therapy: Patient Spontanous Breathing and Patient connected to face mask oxygen  Post-op Assessment: Report given to RN and Post -op Vital signs reviewed and stable  Post vital signs: Reviewed and stable  Last Vitals:  Filed Vitals:   04/10/15 0510  BP: 144/92  Pulse: 76  Temp: 36.3 C  Resp: 18    Complications: No apparent anesthesia complications

## 2015-04-10 NOTE — Anesthesia Postprocedure Evaluation (Signed)
  Anesthesia Post-op Note  Patient: Douglas Taylor  Procedure(s) Performed: Procedure(s): LAPAROSCOPIC INGUINAL HERNIA REPAIR WITH MESH (Right) INSERTION OF MESH (Right)  Patient Location: PACU  Anesthesia Type:General  Level of Consciousness: awake and alert   Airway and Oxygen Therapy: Patient Spontanous Breathing and Patient connected to nasal cannula oxygen  Post-op Pain: mild  Post-op Assessment: Post-op Vital signs reviewed, Patient's Cardiovascular Status Stable, Respiratory Function Stable, Patent Airway and No signs of Nausea or vomiting  Post-op Vital Signs: Reviewed and stable  Last Vitals:  Filed Vitals:   04/10/15 0848  BP: 147/91  Pulse:   Temp:   Resp:     Complications: No apparent anesthesia complications

## 2015-04-10 NOTE — H&P (View-Only) (Signed)
History of Present Illness Ralene Ok MD; 02/10/2015 2:15 PM) Patient words: RIH.  The patient is a 69 year old male who presents with an inguinal hernia. Patient is a 69 year old male who is referred by Dr. Virgina Jock for an evaluation of a right inguinal hernia. Patient states that approximately 3 weeks ago this began with pain. He states that this was reduced in the office and since then has had on and off pain. 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    Other Problems Elbert Ewings, CMA; 02/10/2015 2:02 PM) Anxiety Disorder  Past Surgical History Elbert Ewings, [place]; 02/10/2015 2:02 PM) Vasectomy  Diagnostic Studies History Elbert Ewings, [place]; 02/10/2015 2:02 PM) Colonoscopy 5-10 years ago  Allergies Elbert Ewings, CMA; 02/10/2015 2:03 PM) No Known Drug Allergies03/12/2014  Medication History Elbert Ewings, CMA; 02/10/2015 2:03 PM) ALPRAZolam (0.5MG  Tablet, Oral) Active. Medications Reconciled  Social History Elbert Ewings, [place]; 02/10/2015 2:02 PM) Alcohol use Remotely quit alcohol use. Illicit drug use Remotely quit drug use. Tobacco use Never smoker.  Family History Elbert Ewings, [place]; 02/10/2015 2:02 PM) Arthritis Father. Heart disease in male family member before age 21  Review of Systems Elbert Ewings CMA; 02/10/2015 2:02 PM) General Not Present- Appetite Loss, Chills, Fatigue, Fever, Night Sweats, Weight Gain and Weight Loss. Skin Not Present- Change in Wart/Mole, Dryness, Hives, Jaundice, New Lesions, Non-Healing Wounds, Rash and Ulcer. HEENT Not Present- Earache, Hearing Loss, Hoarseness, Nose Bleed, Oral Ulcers, Ringing in the Ears, Seasonal Allergies, Sinus Pain, Sore Throat, Visual Disturbances, Wears glasses/contact lenses and Yellow Eyes. Respiratory Not Present- Bloody sputum, Chronic Cough, Difficulty Breathing, Snoring and Wheezing. Gastrointestinal Not Present- Abdominal Pain, Bloating, Bloody Stool, Change  in Bowel Habits, Chronic diarrhea, Constipation, Difficulty Swallowing, Excessive gas, Gets full quickly at meals, Hemorrhoids, Indigestion, Nausea, Rectal Pain and Vomiting. Male Genitourinary Not Present- Blood in Urine, Change in Urinary Stream, Frequency, Impotence, Nocturia, Painful Urination, Urgency and Urine Leakage.   Vitals Elbert Ewings CMA; 02/10/2015 2:04 PM) 02/10/2015 2:03 PM Weight: 160 lb Height: 67in Body Surface Area: 1.85 m Body Mass Index: 25.06 kg/m Temp.: 96.58F(Temporal)  Pulse: 79 (Regular)  Resp.: 16 (Unlabored)  BP: 128/70 (Sitting, Left Arm, Standard)    Physical Exam Ralene Ok MD; 02/10/2015 2:16 PM) General Mental Status-Alert. General Appearance-Consistent with stated age. Hydration-Well hydrated. Voice-Normal.  Head and Neck Head-normocephalic, atraumatic with no lesions or palpable masses. Trachea-midline. Thyroid Gland Characteristics - normal size and consistency.  Chest and Lung Exam Chest and lung exam reveals -quiet, even and easy respiratory effort with no use of accessory muscles and on auscultation, normal breath sounds, no adventitious sounds and normal vocal resonance. Inspection Chest Wall - Normal. Back - normal.  Cardiovascular Cardiovascular examination reveals -normal heart sounds, regular rate and rhythm with no murmurs and normal pedal pulses bilaterally.  Abdomen Inspection Skin - Scar - no surgical scars. Hernias - Inguinal hernia - Right - Incarcerated and Reducible. Palpation/Percussion Normal exam - Soft, Non Tender, No Rebound tenderness, No Rigidity (guarding) and No hepatosplenomegaly. Auscultation Normal exam - Bowel sounds normal.    Assessment & Plan Ralene Ok MD; 02/10/2015 2:17 PM) RIGHT INGUINAL HERNIA (550.90  K40.90) Impression: 69 year old male with a right inguinal hernia, likely indirect. I discussed with the patient laparoscopic surgery repair. The patient will  like to wait until his employment allows time for him to recover. When he is ready for surgery he will call us back we can schedule  him at that time. 1. The patient will like to proceed to the operating room for laparoscopic right inguinal hernia repair with mesh. 2. All risks and benefits were discussed with the patient to generally include, but not limited to: infection, bleeding, damage to surrounding structures, acute and chronic nerve pain, and recurrence. Alternatives were offered and described. All questions were answered and the patient voiced understanding of the procedure and wishes to proceed at this point with hernia repair.

## 2015-04-10 NOTE — Anesthesia Procedure Notes (Signed)
Procedure Name: Intubation Date/Time: 04/10/2015 7:32 AM Performed by: Johnathan Hausen A Pre-anesthesia Checklist: Patient identified, Timeout performed, Emergency Drugs available, Suction available and Patient being monitored Patient Re-evaluated:Patient Re-evaluated prior to inductionOxygen Delivery Method: Circle system utilized Preoxygenation: Pre-oxygenation with 100% oxygen Intubation Type: Combination inhalational/ intravenous induction Ventilation: Mask ventilation without difficulty Laryngoscope Size: Mac and 4 Grade View: Grade I Tube type: Oral Tube size: 8.0 mm Number of attempts: 1 Airway Equipment and Method: Stylet Placement Confirmation: ETT inserted through vocal cords under direct vision,  positive ETCO2 and breath sounds checked- equal and bilateral Secured at: 20 cm Tube secured with: Tape Dental Injury: Teeth and Oropharynx as per pre-operative assessment

## 2015-04-10 NOTE — Op Note (Signed)
04/10/2015  8:34 AM  PATIENT:  Douglas Taylor  69 y.o. male  PRE-OPERATIVE DIAGNOSIS:  right inguinal hernia  POST-OPERATIVE DIAGNOSIS:  Right indirect inguinal hernia  PROCEDURE:  Procedure(s): LAPAROSCOPIC INGUINAL HERNIA REPAIR WITH MESH (Right) INSERTION OF MESH (Right)  SURGEON:  Surgeon(s) and Role:    * Ralene Ok, MD - Primary  PHYSICIAN ASSISTANT:   ASSISTANTS: none   ANESTHESIA:   local and general  EBL:   <5cc  BLOOD ADMINISTERED:none  DRAINS: none   LOCAL MEDICATIONS USED:  BUPIVICAINE   SPECIMEN:  No Specimen  DISPOSITION OF SPECIMEN:  N/A  COUNTS:  YES  TOURNIQUET:  * No tourniquets in log *  DICTATION: .Dragon Dictation   Counts: reported as correct x 2  Findings:  The patient had a small right indirect hernia  Indications for procedure:  The patient is a 69 year old male with a right hernia for several months. Patient complained of symptomatology to his right inguinal area. The patient was taken back for elective inguinal hernia repair.  Details of the procedure: The patient was taken back to the operating room. The patient was placed in supine position with bilateral SCDs in place.  The patient was prepped and draped in the usual sterile fashion.  After appropriate anitbiotics were confirmed, a time-out was confirmed and all facts were verified.  0.25% Marcaine was used to infiltrate the umbilical area. A 11-blade was used to cut down the skin and blunt dissection was used to get the anterior fashion.  The anterior fascia was incised approximately 1 cm and the muscles were retracted laterally. Blunt dissection was then used to create a space in the preperitoneal area. At this time a 10 mm camera was then introduced into the space and advanced the pubic tubercle and a 12 mm trocar was placed over this and insufflation was started.  At this time and space was created from medial to laterally the preperitoneal space.  Cooper's ligament was  initially cleaned off.  The hernia sac was identified in the right indirect space. Dissection of the hernia sac was undertaken the vas deferens was identified and protected in all parts of the case.  A large cord lipoma was also dissected from the inguinal canal.  This was removed from the preperitoneal space.  Once the hernia sac was taken down to approximately the umbilicus a Bard 3D Max mesh, size: Large, was  introduced into the preperitoneal space.  The mesh was brought over to cover the direct and indirect hernia spaces.  This was anchored into place and secured to Cooper's ligament with 4.5mm staples from a [place] hernia stapler. It was anchored to the anterior abdominal wall with 4.8 mm staples. The hernia sac was seen lying posterior to the mesh. There was no staples placed laterally. The insufflation was evacuated and the peritoneum was seen posterior to the mesh. The trochars were removed. The anterior fascia was reapproximated using #1 Vicryl on a UR- 6.  Intra-abdominal air was evacuated and the Veress needle removed. The skin was reapproximated using 4-0 Monocryl subcuticular fashion the patient was awakened from general anesthesia and taken to recovery in stable condition.   PLAN OF CARE: Discharge to home after PACU  PATIENT DISPOSITION:  PACU - hemodynamically stable.   Delay start of Pharmacological VTE agent (>24hrs) due to surgical blood loss or risk of bleeding: not applicable

## 2015-04-13 ENCOUNTER — Encounter (HOSPITAL_COMMUNITY): Payer: Self-pay | Admitting: General Surgery

## 2015-04-21 ENCOUNTER — Encounter (HOSPITAL_COMMUNITY): Payer: Self-pay | Admitting: Emergency Medicine

## 2015-04-21 ENCOUNTER — Emergency Department (HOSPITAL_COMMUNITY)
Admission: EM | Admit: 2015-04-21 | Discharge: 2015-04-21 | Disposition: A | Discharge status: Home / Self Care | Payer: Managed Care, Other (non HMO) | Attending: Emergency Medicine | Admitting: Emergency Medicine

## 2015-04-21 ENCOUNTER — Encounter (HOSPITAL_BASED_OUTPATIENT_CLINIC_OR_DEPARTMENT_OTHER): Payer: Self-pay | Admitting: Emergency Medicine

## 2015-04-21 DIAGNOSIS — N359 Urethral stricture, unspecified: Secondary | ICD-10-CM | POA: Diagnosis not present

## 2015-04-21 DIAGNOSIS — R339 Retention of urine, unspecified: Secondary | ICD-10-CM | POA: Diagnosis present

## 2015-04-21 DIAGNOSIS — F419 Anxiety disorder, unspecified: Secondary | ICD-10-CM | POA: Diagnosis not present

## 2015-04-21 DIAGNOSIS — Z79899 Other long term (current) drug therapy: Secondary | ICD-10-CM | POA: Diagnosis not present

## 2015-04-21 DIAGNOSIS — Z8719 Personal history of other diseases of the digestive system: Secondary | ICD-10-CM | POA: Diagnosis not present

## 2015-04-21 DIAGNOSIS — Z9049 Acquired absence of other specified parts of digestive tract: Secondary | ICD-10-CM | POA: Diagnosis not present

## 2015-04-21 DIAGNOSIS — N390 Urinary tract infection, site not specified: Secondary | ICD-10-CM | POA: Diagnosis not present

## 2015-04-21 LAB — CBC WITH DIFFERENTIAL/PLATELET
Basophils Absolute: 0 10*3/uL (ref 0.0–0.1)
Basophils Relative: 0 % (ref 0–1)
Eosinophils Absolute: 0.2 10*3/uL (ref 0.0–0.7)
Eosinophils Relative: 3 % (ref 0–5)
HCT: 41.9 % (ref 39.0–52.0)
Hemoglobin: 13.9 g/dL (ref 13.0–17.0)
LYMPHS PCT: 16 % (ref 12–46)
Lymphs Abs: 1.4 10*3/uL (ref 0.7–4.0)
MCH: 30.2 pg (ref 26.0–34.0)
MCHC: 33.2 g/dL (ref 30.0–36.0)
MCV: 91.1 fL (ref 78.0–100.0)
MONOS PCT: 10 % (ref 3–12)
Monocytes Absolute: 1 10*3/uL (ref 0.1–1.0)
Neutro Abs: 6.7 10*3/uL (ref 1.7–7.7)
Neutrophils Relative %: 71 % (ref 43–77)
Platelets: 249 10*3/uL (ref 150–400)
RBC: 4.6 MIL/uL (ref 4.22–5.81)
RDW: 13.3 % (ref 11.5–15.5)
WBC: 9.3 10*3/uL (ref 4.0–10.5)

## 2015-04-21 LAB — URINALYSIS, ROUTINE W REFLEX MICROSCOPIC
Bilirubin Urine: NEGATIVE
GLUCOSE, UA: NEGATIVE mg/dL
Ketones, ur: NEGATIVE mg/dL
Nitrite: POSITIVE — AB
PH: 7 (ref 5.0–8.0)
PROTEIN: NEGATIVE mg/dL
Specific Gravity, Urine: 1.008 (ref 1.005–1.030)
Urobilinogen, UA: 0.2 mg/dL (ref 0.0–1.0)

## 2015-04-21 LAB — BASIC METABOLIC PANEL
Anion gap: 10 (ref 5–15)
BUN: 7 mg/dL (ref 6–20)
CO2: 28 mmol/L (ref 22–32)
CREATININE: 0.88 mg/dL (ref 0.61–1.24)
Calcium: 9.3 mg/dL (ref 8.9–10.3)
Chloride: 104 mmol/L (ref 101–111)
Glucose, Bld: 109 mg/dL — ABNORMAL HIGH (ref 70–99)
POTASSIUM: 4.2 mmol/L (ref 3.5–5.1)
SODIUM: 142 mmol/L (ref 135–145)

## 2015-04-21 LAB — URINE MICROSCOPIC-ADD ON

## 2015-04-21 MED ORDER — LIDOCAINE HCL 2 % EX GEL
1.0000 "application " | Freq: Once | CUTANEOUS | Status: AC
  Administered 2015-04-21: 1 via URETHRAL
  Filled 2015-04-21: qty 10

## 2015-04-21 MED ORDER — HYDROCODONE-ACETAMINOPHEN 5-325 MG PO TABS
2.0000 | ORAL_TABLET | Freq: Once | ORAL | Status: AC
  Administered 2015-04-21: 2 via ORAL
  Filled 2015-04-21: qty 2

## 2015-04-21 MED ORDER — HYDROCODONE-ACETAMINOPHEN 5-325 MG PO TABS: 1.0000 | ORAL_TABLET | ORAL | Status: DC | PRN

## 2015-04-21 MED ORDER — CIPROFLOXACIN HCL 500 MG PO TABS
500.0000 mg | ORAL_TABLET | Freq: Once | ORAL | Status: AC
  Administered 2015-04-21: 500 mg via ORAL
  Filled 2015-04-21: qty 1

## 2015-04-21 MED ORDER — CIPROFLOXACIN HCL 500 MG PO TABS: 500.0000 mg | ORAL_TABLET | Freq: Two times a day (BID) | ORAL | Status: DC

## 2015-04-21 NOTE — ED Notes (Signed)
Pt states he had a hernia operation on 4/29th. Pt states he's had trouble urinating since, saw his PCP yesterday. States he didn't have any infection, and got some pills for his prostate. Says he took the pills, but this morning he hasn't been able to urinate at all and now is in a lot of pain.

## 2015-04-21 NOTE — Consult Note (Signed)
Reason for Consult: Urinary Retention, Urethral Stricture  Referring Physician: Pryor Curia, DO  Douglas Taylor is an 69 y.o. male.   HPI:   1 - Urinary Retention - pt with new urinary retention likely due to stricture below on eval weak strea, urinary urgency / freqeuncy that has been progressive for past 2 weeks.  Minimal baseline voiding complaints. NO recetn GU infections / fevers. Denies current pain med / anticholinergic use or new LE weakness.  PVR in ER today 600cc with lower abdominal pain.   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 Exam and catheter placment c/w pendulous stricture, mostly distal. 75F catheter placed.  PMH sig for appy, inguinal hernia repair.   Today Douglas Taylor is seen as urgent consult for urinary retention as he is in pain with full bladder and catheter unable to be placed.   Past Medical History  Diagnosis Date  . Anxiety   . Inguinal hernia     Past Surgical History  Procedure Laterality Date  . Appendectomy    . Inguinal hernia repair Right 04/10/2015    Procedure: LAPAROSCOPIC INGUINAL HERNIA REPAIR WITH MESH;  Surgeon: Ralene Ok, MD;  Location: [facility];  Service: General;  Laterality: Right;  . Insertion of mesh Right 04/10/2015    Procedure: INSERTION OF MESH;  Surgeon: Ralene Ok, MD;  Location: [facility];  Service: General;  Laterality: Right;    History reviewed. No pertinent family history.  Social History:  reports that he has never smoked. He does not have any smokeless tobacco history on file. He reports that he does not drink alcohol or use illicit drugs.  Allergies: No Known Allergies  Medications: I have reviewed the patient's current medications.  Results for orders placed or performed during the hospital encounter of 04/21/15 (from the past 48 hour(s))  CBC with Differential     Status: None   Collection Time: 04/21/15  2:10 PM  Result Value Ref Range   WBC 9.3  4.0 - 10.5 K/uL   RBC 4.60 4.22 - 5.81 MIL/uL   Hemoglobin 13.9 13.0 - 17.0 g/dL   HCT 41.9 39.0 - 52.0 %   MCV 91.1 78.0 - 100.0 fL   MCH 30.2 26.0 - 34.0 pg   MCHC 33.2 30.0 - 36.0 g/dL   RDW 13.3 11.5 - 15.5 %   Platelets 249 150 - 400 K/uL   Neutrophils Relative % 71 43 - 77 %   Neutro Abs 6.7 1.7 - 7.7 K/uL   Lymphocytes Relative 16 12 - 46 %   Lymphs Abs 1.4 0.7 - 4.0 K/uL   Monocytes Relative 10 3 - 12 %   Monocytes Absolute 1.0 0.1 - 1.0 K/uL   Eosinophils Relative 3 0 - 5 %   Eosinophils Absolute 0.2 0.0 - 0.7 K/uL   Basophils Relative 0 0 - 1 %   Basophils Absolute 0.0 0.0 - 0.1 K/uL  Basic metabolic panel     Status: Abnormal   Collection Time: 04/21/15  2:10 PM  Result Value Ref Range   Sodium 142 135 - 145 mmol/L   Potassium 4.2 3.5 - 5.1 mmol/L   Chloride 104 101 - 111 mmol/L   CO2 28 22 - 32 mmol/L   Glucose, Bld 109 (H) 70 - 99 mg/dL   BUN 7 6 - 20 mg/dL   Creatinine, Ser 0.88 0.61 - 1.24 mg/dL   Calcium 9.3 8.9 - 10.3 mg/dL  GFR calc non Af Amer >60 >60 mL/min   GFR calc Af Amer >60 >60 mL/min    Comment: (NOTE) The eGFR has been calculated using the CKD EPI equation. This calculation has not been validated in all clinical situations. eGFR's persistently <60 mL/min signify possible Chronic Kidney Disease.    Anion gap 10 5 - 15  Urinalysis, Routine w reflex microscopic     Status: Abnormal   Collection Time: 04/21/15  3:08 PM  Result Value Ref Range   Color, Urine YELLOW YELLOW   APPearance CLOUDY (A) CLEAR   Specific Gravity, Urine 1.008 1.005 - 1.030   pH 7.0 5.0 - 8.0   Glucose, UA NEGATIVE NEGATIVE mg/dL   Hgb urine dipstick TRACE (A) NEGATIVE   Bilirubin Urine NEGATIVE NEGATIVE   Ketones, ur NEGATIVE NEGATIVE mg/dL   Protein, ur NEGATIVE NEGATIVE mg/dL   Urobilinogen, UA 0.2 0.0 - 1.0 mg/dL   Nitrite POSITIVE (A) NEGATIVE   Leukocytes, UA SMALL (A) NEGATIVE  Urine microscopic-add on     Status: Abnormal   Collection Time: 04/21/15  3:08 PM   Result Value Ref Range   WBC, UA 11-20 <3 WBC/hpf   RBC / HPF 0-2 <3 RBC/hpf   Bacteria, UA FEW (A) RARE    No results found.  Review of Systems  Constitutional: Negative.   HENT: Negative.   Eyes: Negative.   Respiratory: Negative.   Cardiovascular: Negative.   Gastrointestinal: Negative.   Genitourinary: Positive for urgency and frequency.       Slowing stream progressively x 1 week.  Musculoskeletal: Negative.   Skin: Negative.   Neurological: Negative.   Endo/Heme/Allergies: Negative.   Psychiatric/Behavioral: Negative.    Blood pressure 134/88, pulse 83, temperature 97.6 F (36.4 C), temperature source Oral, resp. rate 16, SpO2 100 %. Physical Exam  Constitutional: He appears well-developed.  HENT:  Head: Normocephalic.  Pt wearing glasses  Eyes: Pupils are equal, round, and reactive to light.  Neck: Normal range of motion.  Cardiovascular: Normal rate.   Respiratory: Effort normal.  GI:  Recent laparoscopic port sites c/d/i.   Genitourinary:  Moderate shaft edema.   Musculoskeletal: Normal range of motion.  Neurological: He is alert.  Skin: Skin is warm.  Psychiatric: He has a normal mood and affect. His behavior is normal. Judgment and thought content normal.  Pt quite anxious   Using aseptic technique with betadine prep, A single and succesfull attempt was made at passage of 46F foley. Significant resistance distally c/w distal stricture. 10cc sterile water in balloon. Immediate efflux 600cc clear urine.   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 Not favored to be medication related as is typical of most peri-op retention episodes.   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 Now s/p dilation to 46F. Rec current catheter to stay and then trial of void in Urol office in about 2 weeks. Discussed natural history with at least 50% recurrence rate with dilation / catheter  alone.  We will arrange GU follow up.   Douglas Taylor 04/21/2015, 5:17 PM

## 2015-04-21 NOTE — ED Provider Notes (Signed)
TIME SEEN: 1:20 PM  CHIEF COMPLAINT: Urinary retention  HPI: Pt is a 69 y.o. male who recently underwent laparoscopic right inguinal hernia repair with mesh with Dr. Rosendo Gros on April 29 who presents to the emergency department with several days of suprapubic pain, dysuria and no urinary retention. His only been able to urinate a small amount today. No fever, nausea, vomiting or diarrhea. States he did have a Foley catheter placed during the surgical procedure and states it was removed the same day. He is not aware that they had any difficulty placing this fully catheter. Denies a history of urethral stricture in the past.  Patient denies any new medications. No back pain. No numbness, tingling or focal weakness. No overflow incontinence. No bowel incontinence.  ROS: See HPI Constitutional: no fever  Eyes: no drainage  ENT: no runny nose   Cardiovascular:  no chest pain  Resp: no SOB  GI: no vomiting GU: no dysuria Integumentary: no rash  Allergy: no hives  Musculoskeletal: no leg swelling  Neurological: no slurred speech ROS otherwise negative  PAST MEDICAL HISTORY/PAST SURGICAL HISTORY:  Past Medical History  Diagnosis Date  . Anxiety   . Inguinal hernia     MEDICATIONS:  Prior to Admission medications   Medication Sig Start Date End Date Taking? Authorizing Provider  acetaminophen (TYLENOL) 500 MG tablet Take 1,000 mg by mouth every 6 (six) hours as needed for mild pain.    Historical Provider, MD  ALPRAZolam Duanne Moron) 0.5 MG tablet Take 0.75 mg by mouth daily with breakfast.    Historical Provider, MD  clindamycin (CLEOCIN) 150 MG capsule Take 3 capsules (450 mg total) by mouth 3 (three) times daily. Patient not taking: Reported on 04/02/2015 06/23/13   Jarrett Soho Muthersbaugh, PA-C  oxyCODONE-acetaminophen (ROXICET) 5-325 MG per tablet Take 1-2 tablets by mouth every 4 (four) hours as needed. 04/10/15   Ralene Ok, MD  traMADol (ULTRAM) 50 MG tablet Take 50 mg by mouth every 4  (four) hours as needed for moderate pain.    Historical Provider, MD    ALLERGIES:  No Known Allergies  SOCIAL HISTORY:  History  Substance Use Topics  . Smoking status: Never Smoker   . Smokeless tobacco: Not on file  . Alcohol Use: No    FAMILY HISTORY: History reviewed. No pertinent family history.  EXAM: BP 158/94 mmHg  Pulse 101  Temp(Src) 97.6 F (36.4 C) (Oral)  Resp 20  SpO2 96% CONSTITUTIONAL: Alert and oriented and responds appropriately to questions. Well-appearing; well-nourished HEAD: Normocephalic EYES: Conjunctivae clear, PERRL ENT: normal nose; no rhinorrhea; moist mucous membranes; pharynx without lesions noted NECK: Supple, no meningismus, no LAD  CARD: RRR; S1 and S2 appreciated; no murmurs, no clicks, no rubs, no gallops RESP: Normal chest excursion without splinting or tachypnea; breath sounds clear and equal bilaterally; no wheezes, no rhonchi, no rales, no hypoxia or respiratory distress, speaking full sentences ABD/GI: Normal bowel sounds; distended abdomen in the suprapubic region with associated tenderness, no guarding or rebound, no peritoneal signs, multiple laparoscopic surgical scars in the lower abdomen that are healing without swelling erythema or warmth or drainage GU:  Circumcised male, normal external genitalia, normal urethral meatus without blood or discharge, no testicular pain or swelling, no scrotal masses BACK:  The back appears normal and is non-tender to palpation, there is no CVA tenderness EXT: Normal ROM in all joints; non-tender to palpation; no edema; normal capillary refill; no cyanosis, no calf tenderness or swelling    SKIN: Normal  color for age and race; warm NEURO: Moves all extremities equally, sensation to light touch intact diffusely, cranial nerves II through XII intact PSYCH: The patient's mood and manner are appropriate. Grooming and personal hygiene are appropriate.  MEDICAL DECISION MAKING: Patient here with urinary  retention. He appears to have urethral stricture. Unable to pass a 12 [place] Foley catheter or a 25 [place] Coude more than 3 cm past the urethral meatus. Discussed with Dr. Tresa Moore with urology who will see the patient in the emergency department.  ED PROGRESS: Dr. Tresa Moore has placed Foley catheter and has relieved patient's urinary retention. Labs are unremarkable with normal creatinine. He does have a urinary tract infection. Will treat with Cipro. Culture is pending. Have given outpatient urology follow-up information. We'll also discharge with pain medication as needed. Discussed return precautions. He verbalized understanding and is comfortable with plan.     [place], DO 04/21/15 1742

## 2015-04-21 NOTE — ED Notes (Signed)
Mylo Red attempt to insert 16 [place] Coude catheter unsuccessful; Ward aware.

## 2015-04-21 NOTE — ED Notes (Signed)
Douglas Taylor and I tried to do a foley but the patient was too swollen to get a 16 or 12 inch foley in.

## 2015-04-21 NOTE — Discharge Instructions (Signed)
Urinary Tract Infection Urinary tract infections (UTIs) can develop anywhere along your urinary tract. Your urinary tract is your body's drainage system for removing wastes and extra water. Your urinary tract includes two kidneys, two ureters, a bladder, and a urethra. Your kidneys are a pair of bean-shaped organs. Each kidney is about the size of your fist. They are located below your ribs, one on each side of your spine. CAUSES Infections are caused by microbes, which are microscopic organisms, including fungi, viruses, and bacteria. These organisms are so small that they can only be seen through a microscope. Bacteria are the microbes that most commonly cause UTIs. SYMPTOMS  Symptoms of UTIs may vary by age and gender of the patient and by the location of the infection. Symptoms in young women typically include a frequent and intense urge to urinate and a painful, burning feeling in the bladder or urethra during urination. Older women and men are more likely to be tired, shaky, and weak and have muscle aches and abdominal pain. A fever may mean the infection is in your kidneys. Other symptoms of a kidney infection include pain in your back or sides below the ribs, nausea, and vomiting. DIAGNOSIS To diagnose a UTI, your caregiver will ask you about your symptoms. Your caregiver also will ask to provide a urine sample. The urine sample will be tested for bacteria and white blood cells. White blood cells are made by your body to help fight infection. TREATMENT  Typically, UTIs can be treated with medication. Because most UTIs are caused by a bacterial infection, they usually can be treated with the use of antibiotics. The choice of antibiotic and length of treatment depend on your symptoms and the type of bacteria causing your infection. HOME CARE INSTRUCTIONS  If you were prescribed antibiotics, take them exactly as your caregiver instructs you. Finish the medication even if you feel better after you  have only taken some of the medication.  Drink enough water and fluids to keep your urine clear or pale yellow.  Avoid caffeine, tea, and carbonated beverages. They tend to irritate your bladder.  Empty your bladder often. Avoid holding urine for long periods of time.  Empty your bladder before and after sexual intercourse.  After a bowel movement, women should cleanse from front to back. Use each tissue only once. SEEK MEDICAL CARE IF:  1. You have back pain. 2. You develop a fever. 3. Your symptoms do not begin to resolve within 3 days. SEEK IMMEDIATE MEDICAL CARE IF:  1. You have severe back pain or lower abdominal pain. 2. You develop chills. 3. You have nausea or vomiting. 4. You have continued burning or discomfort with urination. MAKE SURE YOU:  1. Understand these instructions. 2. Will watch your condition. 3. Will get help right away if you are not doing well or get worse. Document Released: 09/07/2005 Document Revised: 05/29/2012 Document Reviewed: 01/06/2012 [facility] Patient Information 2015 [place], [place]. This information is not intended to replace advice given to you by your health care provider. Make sure you discuss any questions you have with your health care provider.   Meatal Stenosis Meatal stenosis is a condition where the opening (meatus) on the end of the penis is too small (stenosis). Even if your child has no present symptoms, it is necessary to correct this problem. Over a period of time, the kidneys can be injured from back pressure if this is not corrected. The surgery to correct this is a simple procedure. It is  often done in the caregiver's office. The surgery consists of making a tiny incision (cut by the surgeon) on the bottom of the tip of the penis. This is associated with a very little momentary discomfort, but little post-operative pain.  HOME CARE INSTRUCTIONS  Following the procedure, put petroleum jelly on the incision four times per day. The  incision should be gently pulled apart. This is absolutely necessary so the incision can not grow back together again. This may cause a very minimal discomfort, so do not be alarmed if your baby cries. Only give your child over-the-counter or prescription medicines for pain, discomfort, or fever as directed by their caregiver.  SEEK IMMEDIATE MEDICAL CARE IF:   The penis becomes red, swollen, or has a purulent (pus like) drainage.  There is continued bleeding that cannot be stopped with gentle pressure.  Your baby runs an unexplained temperature.  Your baby does not wet his or her diapers the same as always. Document Released: 11/25/2000 Document Revised: 02/20/2012 Document Reviewed: 10/10/2008 [facility] Patient Information 2015 [place], [place]. This information is not intended to replace advice given to you by your health care provider. Make sure you discuss any questions you have with your health care provider.    Foley Catheter Care A Foley catheter is a soft, flexible tube that is placed into the bladder to drain urine. A Foley catheter may be inserted if:  You leak urine or are not able to control when you urinate (urinary incontinence).  You are not able to urinate when you need to (urinary retention).  You had prostate surgery or surgery on the genitals.  You have certain medical conditions, such as multiple sclerosis, dementia, or a spinal cord injury. If you are going home with a Foley catheter in place, follow the instructions below. TAKING CARE OF THE CATHETER 4. Wash your hands with soap and water. 5. Using mild soap and warm water on a clean washcloth:  Clean the area on your body closest to the catheter insertion site using a circular motion, moving away from the catheter. Never wipe toward the catheter because this could sweep bacteria up into the urethra and cause infection.  Remove all traces of soap. Pat the area dry with a clean towel. For males, reposition the  foreskin. 6. Attach the catheter to your leg so there is no tension on the catheter. Use adhesive tape or a leg strap. If you are using adhesive tape, remove any sticky residue left behind by the previous tape you used. 7. Keep the drainage bag below the level of the bladder, but keep it off the floor. 8. Check throughout the day to be sure the catheter is working and urine is draining freely. Make sure the tubing does not become kinked. 9. Do not pull on the catheter or try to remove it. Pulling could damage internal tissues. TAKING CARE OF THE DRAINAGE BAGS You will be given two drainage bags to take home. One is a large overnight drainage bag, and the other is a smaller leg bag that fits underneath clothing. You may wear the overnight bag at any time, but you should never wear the smaller leg bag at night. Follow the instructions below for how to empty, change, and clean your drainage bags. Emptying the Drainage Bag You must empty your drainage bag when it is  - full or at least 2-3 times a day. 5. Wash your hands with soap and water. 6. Keep the drainage bag below your hips, below  the level of your bladder. This stops urine from going back into the tubing and into your bladder. 7. Hold the dirty bag over the toilet or a clean container. 8. Open the pour spout at the bottom of the bag and empty the urine into the toilet or container. Do not let the pour spout touch the toilet, container, or any other surface. Doing so can place bacteria on the bag, which can cause an infection. 9. Clean the pour spout with a gauze pad or cotton ball that has rubbing alcohol on it. 10. Close the pour spout. 11. Attach the bag to your leg with adhesive tape or a leg strap. 12. Wash your hands well. Changing the Drainage Bag Change your drainage bag once a month or sooner if it starts to smell bad or look dirty. Below are steps to follow when changing the drainage bag. 4. Wash your hands with soap and  water. 5. Pinch off the rubber catheter so that urine does not spill out. 6. Disconnect the catheter tube from the drainage tube at the connection valve. Do not let the tubes touch any surface. 7. Clean the end of the catheter tube with an alcohol wipe. Use a different alcohol wipe to clean the end of the drainage tube. 8. Connect the catheter tube to the drainage tube of the clean drainage bag. 9. Attach the new bag to the leg with adhesive tape or a leg strap. Avoid attaching the new bag too tightly. 10. Wash your hands well. Cleaning the Drainage Bag 1. Wash your hands with soap and water. 2. Wash the bag in warm, soapy water. 3. Rinse the bag thoroughly with warm water. 4. Fill the bag with a solution of white vinegar and water (1 cup vinegar to 1 qt warm water [.2 L vinegar to 1 L warm water]). Close the bag and soak it for 30 minutes in the solution. 5. Rinse the bag with warm water. 6. Hang the bag to dry with the pour spout open and hanging downward. 7. Store the clean bag (once it is dry) in a clean plastic bag. 8. Wash your hands well. PREVENTING INFECTION  Wash your hands before and after handling your catheter.  Take showers daily and wash the area where the catheter enters your body. Do not take baths. Replace wet leg straps with dry ones, if this applies.  Do not use powders, sprays, or lotions on the genital area. Only use creams, lotions, or ointments as directed by your caregiver.  For females, wipe from front to back after each bowel movement.  Drink enough fluids to keep your urine clear or pale yellow unless you have a fluid restriction.  Do not let the drainage bag or tubing touch or lie on the floor.  Wear cotton underwear to absorb moisture and to keep your skin drier. SEEK MEDICAL CARE IF:   Your urine is cloudy or smells unusually bad.  Your catheter becomes clogged.  You are not draining urine into the bag or your bladder feels full.  Your catheter  starts to leak. SEEK IMMEDIATE MEDICAL CARE IF:   You have pain, swelling, redness, or pus where the catheter enters the body.  You have pain in the abdomen, legs, lower back, or bladder.  You have a fever.  You see blood fill the catheter, or your urine is pink or red.  You have nausea, vomiting, or chills.  Your catheter gets pulled out. MAKE SURE YOU:   Understand these  instructions.  Will watch your condition.  Will get help right away if you are not doing well or get worse. Document Released: 11/28/2005 Document Revised: 04/14/2014 Document Reviewed: 11/19/2012 [facility] Patient Information 2015 [place], [place]. This information is not intended to replace advice given to you by your health care provider. Make sure you discuss any questions you have with your health care provider.

## 2015-04-24 LAB — URINE CULTURE: Colony Count: >=100000

## 2015-04-25 NOTE — Progress Notes (Signed)
ED Antimicrobial Stewardship Positive Culture Follow Up   Douglas Taylor is an 69 y.o. male who presented to [facility] on 04/21/2015 with a chief complaint of  Chief Complaint  Patient presents with  . Urinary Retention    Recent Results (from the past 720 hour(s))  Urine culture     Status: None   Collection Time: 04/21/15  3:47 PM  Result Value Ref Range Status   Specimen Description URINE, CATHETERIZED  Final   Special Requests NONE  Final   Colony Count   Final    >=100,000 COLONIES/ML Performed at [facility]    Culture   Final    STAPHYLOCOCCUS SPECIES (COAGULASE NEGATIVE) Note: RIFAMPIN AND GENTAMICIN SHOULD NOT BE USED AS SINGLE DRUGS FOR TREATMENT OF STAPH INFECTIONS. Performed at [facility]    Report Status 04/24/2015 FINAL  Final   Organism ID, Bacteria STAPHYLOCOCCUS SPECIES (COAGULASE NEGATIVE)  Final      Susceptibility   Staphylococcus species (coagulase negative) - MIC*    GENTAMICIN <=0.5 SENSITIVE Sensitive     LEVOFLOXACIN <=0.12 SENSITIVE Sensitive     NITROFURANTOIN <=16 SENSITIVE Sensitive     OXACILLIN <=0.25 SENSITIVE Sensitive     PENICILLIN >=0.5 RESISTANT Resistant     RIFAMPIN <=0.5 SENSITIVE Sensitive     TRIMETH/SULFA <=10 SENSITIVE Sensitive     VANCOMYCIN 2 SENSITIVE Sensitive     TETRACYCLINE 2 SENSITIVE Sensitive     * STAPHYLOCOCCUS SPECIES (COAGULASE NEGATIVE)    [x]  Treated with Ciprofloxacin, organism was not tested against the prescribed antimicrobial  [name] who presented with recent hernia repair with mesh on 4/29 who presented to the ED on 5/10 with urinary retention. Urology consulted and relieved with placement of foley - UTI noted.  Cipro may or may not cover CoNS - so will plan to call the patient for a symptom check - If improved, no UTI sx - continue with Cipro as prescribed - If not improved and with UTI sx - d/c Cipro and change to Doxy 100 mg bid x 7 days  ED Provider: Noland Fordyce,  PA-C  Lawson Radar 04/25/2015, 11:53 AM Infectious Diseases Pharmacist Phone# [phone]

## 2015-04-26 ENCOUNTER — Telehealth: Payer: Self-pay | Admitting: Emergency Medicine

## 2015-04-26 NOTE — Telephone Encounter (Signed)
Post ED Visit - Positive Culture Follow-up: Successful Patient Follow-Up  Culture assessed and recommendations reviewed by: []  Heide Guile, Pharm.D., BCPS-AQ ID [x]  Alycia Rossetti, Pharm.D., BCPS []  [place], Pharm.D., BCPS, AAHIVP []  Legrand Como, Pharm.D., BCPS, AAHIVP []  Tegan Magsam, Pharm.D. []  Cassie [place]ch[place], [place].D.  Positive Urine culture  []  Patient discharged without antimicrobial prescription and treatment is now indicated [x]  Organism is resistant to prescribed ED discharge antimicrobial []  Patient with positive blood cultures  Changes discussed with ED provider: Noland Fordyce, PA New antibiotic prescription: Doxycycline 100 mg PO BID x seven days Called to [facility] [phone]  Contacted patient, date 04/26/15, time 0913 pt notified, reports that he is not improved. Instructed to d/c Cipro,start Doxycycline and follow up with urology Dr Tresa Moore. RX Doxycycline called to [facility] [phone].   Ernesta Amble 04/26/2015, 9:29 AM

## 2015-05-05 ENCOUNTER — Encounter (HOSPITAL_BASED_OUTPATIENT_CLINIC_OR_DEPARTMENT_OTHER): Payer: Self-pay | Admitting: *Deleted

## 2015-05-05 ENCOUNTER — Other Ambulatory Visit: Payer: Self-pay | Admitting: Urology

## 2015-05-05 NOTE — Progress Notes (Signed)
NPO AFTER MN.  ARRIVE AT 1115.  CURRENT LAB RESULTS IN CHART AND EPIC.  WILL TAKE XANAX AM DOS W/ SIPS OF WATER.

## 2015-05-07 ENCOUNTER — Ambulatory Visit (HOSPITAL_BASED_OUTPATIENT_CLINIC_OR_DEPARTMENT_OTHER): Payer: Managed Care, Other (non HMO) | Admitting: Anesthesiology

## 2015-05-07 ENCOUNTER — Encounter (HOSPITAL_BASED_OUTPATIENT_CLINIC_OR_DEPARTMENT_OTHER)
Admission: RE | Disposition: A | Discharge status: Home / Self Care | Payer: Self-pay | Source: Ambulatory Visit | Attending: Urology

## 2015-05-07 ENCOUNTER — Ambulatory Visit (HOSPITAL_BASED_OUTPATIENT_CLINIC_OR_DEPARTMENT_OTHER)
Admission: RE | Admit: 2015-05-07 | Discharge: 2015-05-07 | Disposition: A | Discharge status: Home / Self Care | Payer: Managed Care, Other (non HMO) | Source: Ambulatory Visit | Attending: Urology | Admitting: Urology

## 2015-05-07 ENCOUNTER — Encounter (HOSPITAL_BASED_OUTPATIENT_CLINIC_OR_DEPARTMENT_OTHER): Payer: Self-pay | Admitting: Anesthesiology

## 2015-05-07 DIAGNOSIS — R339 Retention of urine, unspecified: Secondary | ICD-10-CM | POA: Insufficient documentation

## 2015-05-07 DIAGNOSIS — F419 Anxiety disorder, unspecified: Secondary | ICD-10-CM | POA: Diagnosis not present

## 2015-05-07 DIAGNOSIS — K219 Gastro-esophageal reflux disease without esophagitis: Secondary | ICD-10-CM | POA: Diagnosis not present

## 2015-05-07 DIAGNOSIS — N359 Urethral stricture, unspecified: Secondary | ICD-10-CM | POA: Insufficient documentation

## 2015-05-07 HISTORY — DX: Unspecified urethral stricture, male, unspecified site: N35.919

## 2015-05-07 HISTORY — PX: CYSTOSCOPY WITH URETHRAL DILATATION: SHX5125

## 2015-05-07 HISTORY — PX: CYSTOSCOPY WITH RETROGRADE URETHROGRAM: SHX6309

## 2015-05-07 HISTORY — DX: Other seasonal allergic rhinitis: J30.2

## 2015-05-07 HISTORY — DX: Personal history of peptic ulcer disease: Z87.11

## 2015-05-07 HISTORY — DX: Personal history of other diseases of the digestive system: Z87.19

## 2015-05-07 HISTORY — DX: Presence of spectacles and contact lenses: Z97.3

## 2015-05-07 HISTORY — DX: Gastro-esophageal reflux disease without esophagitis: K21.9

## 2015-05-07 LAB — POCT I-STAT, CHEM 8
BUN: 10 mg/dL (ref 6–20)
CALCIUM ION: 1.21 mmol/L (ref 1.13–1.30)
CHLORIDE: 104 mmol/L (ref 101–111)
CREATININE: 0.9 mg/dL (ref 0.61–1.24)
GLUCOSE: 104 mg/dL — AB (ref 65–99)
HEMATOCRIT: 42 % (ref 39.0–52.0)
Hemoglobin: 14.3 g/dL (ref 13.0–17.0)
Potassium: 3.7 mmol/L (ref 3.5–5.1)
Sodium: 143 mmol/L (ref 135–145)
TCO2: 23 mmol/L (ref 0–100)

## 2015-05-07 SURGERY — CYSTOSCOPY WITH RETROGRADE URETHROGRAM
Anesthesia: General | Site: Urethra

## 2015-05-07 MED ORDER — ONDANSETRON HCL 4 MG/2ML IJ SOLN
INTRAMUSCULAR | Status: DC | PRN
  Administered 2015-05-07: 4 mg via INTRAVENOUS

## 2015-05-07 MED ORDER — SENNOSIDES-DOCUSATE SODIUM 8.6-50 MG PO TABS: 1.0000 | ORAL_TABLET | Freq: Two times a day (BID) | ORAL | Status: DC

## 2015-05-07 MED ORDER — MIDAZOLAM HCL 2 MG/2ML IJ SOLN
INTRAMUSCULAR | Status: AC
  Filled 2015-05-07: qty 2

## 2015-05-07 MED ORDER — HYDROCODONE-ACETAMINOPHEN 5-325 MG PO TABS: 1.0000 | ORAL_TABLET | Freq: Four times a day (QID) | ORAL | Status: DC | PRN

## 2015-05-07 MED ORDER — LACTATED RINGERS IV SOLN
INTRAVENOUS | Status: DC
  Administered 2015-05-07: 12:00:00 via INTRAVENOUS
  Filled 2015-05-07: qty 1000

## 2015-05-07 MED ORDER — LIDOCAINE HCL (CARDIAC) 20 MG/ML IV SOLN
INTRAVENOUS | Status: DC | PRN
  Administered 2015-05-07: 50 mg via INTRAVENOUS

## 2015-05-07 MED ORDER — ACETAMINOPHEN 10 MG/ML IV SOLN
INTRAVENOUS | Status: DC | PRN
  Administered 2015-05-07: 1000 mg via INTRAVENOUS

## 2015-05-07 MED ORDER — OXYCODONE HCL 5 MG PO TABS
ORAL_TABLET | ORAL | Status: AC
  Filled 2015-05-07: qty 1

## 2015-05-07 MED ORDER — GENTAMICIN IN SALINE 1.6-0.9 MG/ML-% IV SOLN
80.0000 mg | INTRAVENOUS | Status: DC
  Filled 2015-05-07: qty 50

## 2015-05-07 MED ORDER — KETOROLAC TROMETHAMINE 30 MG/ML IJ SOLN
INTRAMUSCULAR | Status: DC | PRN
  Administered 2015-05-07: 15 mg via INTRAVENOUS

## 2015-05-07 MED ORDER — PROPOFOL 10 MG/ML IV BOLUS
INTRAVENOUS | Status: DC | PRN
  Administered 2015-05-07: 150 mg via INTRAVENOUS

## 2015-05-07 MED ORDER — FENTANYL CITRATE (PF) 100 MCG/2ML IJ SOLN
INTRAMUSCULAR | Status: AC
  Filled 2015-05-07: qty 4

## 2015-05-07 MED ORDER — SULFAMETHOXAZOLE-TRIMETHOPRIM 800-160 MG PO TABS: 1.0000 | ORAL_TABLET | Freq: Two times a day (BID) | ORAL | Status: DC

## 2015-05-07 MED ORDER — FENTANYL CITRATE (PF) 100 MCG/2ML IJ SOLN
25.0000 ug | INTRAMUSCULAR | Status: DC | PRN
  Administered 2015-05-07 (×2): 25 ug via INTRAVENOUS
  Filled 2015-05-07: qty 1

## 2015-05-07 MED ORDER — FENTANYL CITRATE (PF) 100 MCG/2ML IJ SOLN
INTRAMUSCULAR | Status: DC | PRN
  Administered 2015-05-07: 50 ug via INTRAVENOUS

## 2015-05-07 MED ORDER — IOHEXOL 350 MG/ML SOLN
INTRAVENOUS | Status: DC | PRN
  Administered 2015-05-07: 25 mL

## 2015-05-07 MED ORDER — PROMETHAZINE HCL 25 MG/ML IJ SOLN
6.2500 mg | INTRAMUSCULAR | Status: DC | PRN
  Filled 2015-05-07: qty 1

## 2015-05-07 MED ORDER — DEXAMETHASONE SODIUM PHOSPHATE 4 MG/ML IJ SOLN
INTRAMUSCULAR | Status: DC | PRN
  Administered 2015-05-07: 10 mg via INTRAVENOUS

## 2015-05-07 MED ORDER — SODIUM CHLORIDE 0.9 % IR SOLN
Status: DC | PRN
  Administered 2015-05-07: 3000 mL

## 2015-05-07 MED ORDER — OXYCODONE HCL 5 MG PO TABS
5.0000 mg | ORAL_TABLET | Freq: Once | ORAL | Status: AC
  Administered 2015-05-07: 5 mg via ORAL
  Filled 2015-05-07: qty 1

## 2015-05-07 MED ORDER — GENTAMICIN SULFATE 40 MG/ML IJ SOLN
5.0000 mg/kg | Freq: Once | INTRAVENOUS | Status: AC
  Administered 2015-05-07: 360 mg via INTRAVENOUS
  Filled 2015-05-07: qty 9

## 2015-05-07 MED ORDER — FENTANYL CITRATE (PF) 100 MCG/2ML IJ SOLN
INTRAMUSCULAR | Status: AC
  Filled 2015-05-07: qty 2

## 2015-05-07 SURGICAL SUPPLY — 32 items
BAG URINE DRAINAGE (UROLOGICAL SUPPLIES) IMPLANT
BAG URINE LEG 19OZ MD ST LTX (BAG) IMPLANT
BAG URO CATCHER STRL LF (DRAPE) ×2 IMPLANT
BALLN NEPHROSTOMY (BALLOONS)
BALLOON NEPHROSTOMY (BALLOONS) IMPLANT
CATH FOLEY 2W COUNCIL 20FR 5CC (CATHETERS) IMPLANT
CATH FOLEY 2W COUNCIL 5CC 16FR (CATHETERS) IMPLANT
CATH FOLEY 2W COUNCIL 5CC 18FR (CATHETERS) IMPLANT
CATH FOLEY 2WAY  3CC 10FR (CATHETERS)
CATH FOLEY 2WAY 3CC 10FR (CATHETERS) IMPLANT
CATH FOLEY 2WAY SLVR  5CC 12FR (CATHETERS) ×1
CATH FOLEY 2WAY SLVR  5CC 24FR (CATHETERS) ×1
CATH FOLEY 2WAY SLVR 5CC 12FR (CATHETERS) ×1 IMPLANT
CATH FOLEY 2WAY SLVR 5CC 24FR (CATHETERS) ×1 IMPLANT
CATH ROBINSON RED A/P 14FR (CATHETERS) IMPLANT
CATH X-FORCE N30 NEPHROSTOMY (TUBING) ×2 IMPLANT
CLOTH BEACON ORANGE TIMEOUT ST (SAFETY) ×2 IMPLANT
ELECT REM PT RETURN 9FT ADLT (ELECTROSURGICAL) ×2
ELECTRODE REM PT RTRN 9FT ADLT (ELECTROSURGICAL) ×1 IMPLANT
GLOVE BIO SURGEON STRL SZ 6.5 (GLOVE) ×2 IMPLANT
GLOVE BIO SURGEON STRL SZ7.5 (GLOVE) ×2 IMPLANT
GLOVE BIOGEL PI IND STRL 6.5 (GLOVE) ×2 IMPLANT
GLOVE BIOGEL PI INDICATOR 6.5 (GLOVE) ×2
GOWN STRL REUS W/ TWL LRG LVL3 (GOWN DISPOSABLE) ×3 IMPLANT
GOWN STRL REUS W/TWL LRG LVL3 (GOWN DISPOSABLE) ×3
GUIDEWIRE STR DUAL SENSOR (WIRE) IMPLANT
HOLDER FOLEY CATH W/STRAP (MISCELLANEOUS) IMPLANT
IV NS IRRIG 3000ML ARTHROMATIC (IV SOLUTION) ×2 IMPLANT
NEEDLE HYPO 18GX1.5 BLUNT FILL (NEEDLE) IMPLANT
PACK CYSTO (CUSTOM PROCEDURE TRAY) ×2 IMPLANT
SYR 20CC LL (SYRINGE) IMPLANT
WATER STERILE IRR 3000ML UROMA (IV SOLUTION) IMPLANT

## 2015-05-07 NOTE — Anesthesia Preprocedure Evaluation (Signed)
Anesthesia Evaluation  Patient identified by MRN, date of birth, ID band Patient awake    Reviewed: Allergy & Precautions, NPO status , Patient's Chart, lab work & pertinent test results  Airway Mallampati: II  TM Distance: >3 FB Neck ROM: Full    Dental no notable dental hx.    Pulmonary neg pulmonary ROS,  breath sounds clear to auscultation  Pulmonary exam normal       Cardiovascular negative cardio ROS Normal cardiovascular examRhythm:Regular Rate:Normal     Neuro/Psych Anxiety negative neurological ROS     GI/Hepatic Neg liver ROS, GERD-  ,  Endo/Other  negative endocrine ROS  Renal/GU negative Renal ROS  negative genitourinary   Musculoskeletal negative musculoskeletal ROS (+)   Abdominal   Peds negative pediatric ROS (+)  Hematology negative hematology ROS (+)   Anesthesia Other Findings   Reproductive/Obstetrics negative OB ROS                             Anesthesia Physical Anesthesia Plan  ASA: II  Anesthesia Plan: General   Post-op Pain Management:    Induction: Intravenous  Airway Management Planned: LMA  Additional Equipment:   Intra-op Plan:   Post-operative Plan: Extubation in OR  Informed Consent: I have reviewed the patients History and Physical, chart, labs and discussed the procedure including the risks, benefits and alternatives for the proposed anesthesia with the patient or authorized representative who has indicated his/her understanding and acceptance.   Dental advisory given  Plan Discussed with: CRNA  Anesthesia Plan Comments:         Anesthesia Quick Evaluation

## 2015-05-07 NOTE — Transfer of Care (Signed)
Immediate Anesthesia Transfer of Care Note  Patient: Douglas Taylor  Procedure(s) Performed: Procedure(s): CYSTOSCOPY WITH RETROGRADE URETHROGRAM (N/A) CYSTOSCOPY WITH  BALLOON URETHRAL DILATATION (N/A)  Patient Location: PACU  Anesthesia Type:General  Level of Consciousness: awake and oriented  Airway & Oxygen Therapy: Patient Spontanous Breathing and Patient connected to nasal cannula oxygen  Post-op Assessment: Report given to RN  Post vital signs: Reviewed and stable  Last Vitals:  Filed Vitals:   05/07/15 1106  BP: 131/87  Pulse: 78  Temp: 36.5 C  Resp: 16    Complications: No apparent anesthesia complications

## 2015-05-07 NOTE — Anesthesia Postprocedure Evaluation (Signed)
  Anesthesia Post-op Note  Patient: Douglas Taylor  Procedure(s) Performed: Procedure(s) (LRB): CYSTOSCOPY WITH RETROGRADE URETHROGRAM (N/A) CYSTOSCOPY WITH  BALLOON URETHRAL DILATATION (N/A)  Patient Location: PACU  Anesthesia Type: general  Level of Consciousness: awake and alert   Airway and Oxygen Therapy: Patient Spontanous Breathing  Post-op Pain: mild  Post-op Assessment: Post-op Vital signs reviewed, Patient's Cardiovascular Status Stable, Respiratory Function Stable, Patent Airway and No signs of Nausea or vomiting  Last Vitals:  Filed Vitals:   05/07/15 1440  BP: 160/90  Pulse: 79  Temp: 36.6 C  Resp: 16    Post-op Vital Signs: stable   Complications: No apparent anesthesia complications

## 2015-05-07 NOTE — Discharge Instructions (Signed)
1 - You may have urinary urgency (bladder spasms) and bloody urine on / off with catheter in place. This is normal.  2 - Call MD or go to ER for fever >102, severe pain / nausea / vomiting not relieved by medications, or acute change in medical status    CYSTOSCOPY HOME CARE INSTRUCTIONS  Activity: Rest for the remainder of the day.  Do not drive or operate equipment today.  You may resume normal activities in one to two days as instructed by your physician.   Meals: Drink plenty of liquids and eat light foods such as gelatin or soup this evening.  You may return to a normal meal plan tomorrow.  Return to Work: You may return to work in one to two days or as instructed by your physician.  Special Instructions / Symptoms: Call your physician if any of these symptoms occur:   -persistent or heavy bleeding  -bleeding which continues after first few urination  -large blood clots that are difficult to pass  -urine stream diminishes or stops completely  -fever equal to or higher than 101 degrees Farenheit.  -cloudy urine with a strong, foul odor  -severe pain  Females should always wipe from front to back after elimination.  You may feel some burning pain when you urinate.  This should disappear with time.  Applying moist heat to the lower abdomen or a hot tub bath may help relieve the pain. \     Post Anesthesia Home Care Instructions  Activity: Get plenty of rest for the remainder of the day. A responsible adult should stay with you for 24 hours following the procedure.  For the next 24 hours, DO NOT: -Drive a car -Paediatric nurse -Drink alcoholic beverages -Take any medication unless instructed by your physician -Make any legal decisions or sign important papers.  Meals: Start with liquid foods such as gelatin or soup. Progress to regular foods as tolerated. Avoid greasy, spicy, heavy foods. If nausea and/or vomiting occur, drink only clear liquids until the nausea and/or  vomiting subsides. Call your physician if vomiting continues.  Special Instructions/Symptoms: Your throat may feel dry or sore from the anesthesia or the breathing tube placed in your throat during surgery. If this causes discomfort, gargle with warm salt water. The discomfort should disappear within 24 hours.  If you had a scopolamine patch placed behind your ear for the management of post- operative nausea and/or vomiting:  1. The medication in the patch is effective for 72 hours, after which it should be removed.  Wrap patch in a tissue and discard in the trash. Wash hands thoroughly with soap and water. 2. You may remove the patch earlier than 72 hours if you experience unpleasant side effects which may include dry mouth, dizziness or visual disturbances. 3. Avoid touching the patch. Wash your hands with soap and water after contact with the patch.

## 2015-05-07 NOTE — H&P (Signed)
Douglas Taylor is an 69 y.o. male.    Chief Complaint: Pre-op Cystoscopy and Urethral Dilation  HPI:   1 - Urinary Retention - pt with new urinary retention likely due to stricture below on eval weak strea, urinary urgency / freqeuncy 04/2015  Minimal baseline voiding complaints. NO recetn GU infections / fevers. Denies current pain med / anticholinergic use or new LE weakness.  PVR in ER  600cc with lower abdominal pain. He had peri-op foley at time of hernia repair few weeks before stricture onset.   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 1F catheter placed.  PMH sig for appy, inguinal hernia repair. His PCP is Dr Virgina Jock with [facility]. He works in Teacher, adult education at [employer].   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 He passed trial of void in our office few days ago, but urethra only 1F.    Past Medical History  Diagnosis Date  . Anxiety   . Urethral stricture     W/ RETENTION  . History of gastric ulcer     2004 APPROX.  Marland Kitchen GERD (gastroesophageal reflux disease)   . Seasonal allergies   . Wears glasses     Past Surgical History  Procedure Laterality Date  . Inguinal hernia repair Right 04/10/2015    Procedure: LAPAROSCOPIC INGUINAL HERNIA REPAIR WITH MESH;  Surgeon: Ralene Ok, MD;  Location: [facility];  Service: General;  Laterality: Right;  . Insertion of mesh Right 04/10/2015    Procedure: INSERTION OF MESH;  Surgeon: Ralene Ok, MD;  Location: [facility];  Service: General;  Laterality: Right;    History reviewed. No pertinent family history. Social History:  reports that he has never smoked. He has never used smokeless tobacco. He reports that he does not drink alcohol or use illicit drugs.  Allergies: No Known  Allergies  No prescriptions prior to admission    No results found for this or any previous visit (from the past 48 hour(s)). No results found.  Review of Systems  Constitutional: Negative.   HENT: Negative.   Eyes: Negative.   Respiratory: Negative.   Cardiovascular: Negative.   Gastrointestinal: Negative.   Genitourinary: Negative.   Musculoskeletal: Negative.   Skin: Negative.   Neurological: Negative.   Endo/Heme/Allergies: Negative.   Psychiatric/Behavioral: Negative.     Height 5' 7.5" (1.715 m), weight 70.761 kg (156 lb). Physical Exam  Constitutional: He appears well-developed.  HENT:  Head: Normocephalic.  Eyes: Pupils are equal, round, and reactive to light.  Neck: Normal range of motion.  Cardiovascular: Normal rate.   Respiratory: Effort normal.  GI: Soft.  Genitourinary: Penis normal.  No CVAT  Musculoskeletal: Normal range of motion.  Neurological: He is alert.  Skin: Skin is warm.  Psychiatric: He has a normal mood and affect. His behavior is normal. Judgment and thought content normal.     Assessment/Plan    1 - Urinary Retention - likely due to stricture below, now partially dilated.   2 - Urethral Stricture - suspect distal stricture, but no piror cysto and now partially dilated (1F). Rec formal operative cysto / dilation with goal of furhter anatomic dilineation and dilation to more physiologic caliber and he wishes to proceed.  Risks, benefits, alternatives discussed as well as role of post-op catheterization  temporarily.    [name] 05/07/2015, 10:49 AM

## 2015-05-07 NOTE — Anesthesia Procedure Notes (Signed)
Procedure Name: LMA Insertion Date/Time: 05/07/2015 12:43 PM Performed by: Bethena Roys T Pre-anesthesia Checklist: Patient identified, Emergency Drugs available, Suction available and Patient being monitored Patient Re-evaluated:Patient Re-evaluated prior to inductionOxygen Delivery Method: Circle System Utilized Preoxygenation: Pre-oxygenation with 100% oxygen Intubation Type: IV induction Ventilation: Mask ventilation without difficulty LMA: LMA inserted LMA Size: 4.0 Number of attempts: 1 Airway Equipment and Method: Bite block Placement Confirmation: positive ETCO2 Dental Injury: Teeth and Oropharynx as per pre-operative assessment

## 2015-05-07 NOTE — Brief Op Note (Signed)
05/07/2015  1:09 PM  PATIENT:  Douglas Taylor  69 y.o. male  PRE-OPERATIVE DIAGNOSIS:  URETHRAL STRICTURE  POST-OPERATIVE DIAGNOSIS:  URETHRAL STRICTURE  PROCEDURE:  Procedure(s): CYSTOSCOPY WITH RETROGRADE URETHROGRAM (N/A) CYSTOSCOPY WITH  BALLOON URETHRAL DILATATION (N/A)  SURGEON:  Surgeon(s) and Role:    * Alexis Frock, MD - Primary  PHYSICIAN ASSISTANT:   ASSISTANTS: none   ANESTHESIA:   general  EBL:  Total I/O In: 250 [I.V.:250] Out: -   BLOOD ADMINISTERED:none  DRAINS: 54F foley to straight drain   LOCAL MEDICATIONS USED:  NONE  SPECIMEN:  No Specimen  DISPOSITION OF SPECIMEN:  N/A  COUNTS:  YES  TOURNIQUET:  * No tourniquets in log *  DICTATION: .Other Dictation: Dictation Number [id number]  PLAN OF CARE: Discharge to home after PACU  PATIENT DISPOSITION:  PACU - hemodynamically stable.   Delay start of Pharmacological VTE agent (>24hrs) due to surgical blood loss or risk of bleeding: not applicable

## 2015-05-08 NOTE — Op Note (Signed)
NAMEJOHNCARLO, MAALOUF NO.:  [id number]  MEDICAL RECORD NO.:  [id number]  LOCATION:                                 FACILITY:  PHYSICIAN:  Alexis Frock, MD     DATE OF BIRTH:  08-Jun-1946  DATE OF PROCEDURE:  05/07/2015                              OPERATIVE REPORT  DIAGNOSIS:  High-grade urethral stricture.  PROCEDURE: 1. Retrograde urethrogram with interpretation. 2. Dilation of urethral stricture. 3. Cystourethroscopy.  ESTIMATED BLOOD LOSS:  Nil.  COMPLICATIONS:  None.  SPECIMENS:  None.  FINDINGS: 1. Extensive multifocal long segment urethral stricture throughout the     pendulous urethra, most prominent distally by retrograde     urethrogram and cystoscopy. 2. Unremarkable urinary bladder. 3. Placement of 24-French Foley catheter to straight drain.  INDICATION:  Mr. Leider is a pleasant 69 year old gentleman with recent history of inguinal hernia repair.  He had approximately 2 weeks postoperatively developed frank urinary retention with inability to place Foley catheter in the ER.  Emergent consultation was sought and a small 14-French Foley catheter was placed with significant resistance at the level of the bladder for urgent decompression.  We felt that this was likely due to urethral stricture disease.  He underwent trial of void in the office yesterday, which he passed, however, we discussed that given his minimal dilation to only 14-French in the emergency room that this would be likely to recur and we discussed additional options including more definitive operative dilation with the goal of further dilation as well as allowing cystourethroscopy to help verify the etiology and extent of his stricture disease, and he wished to proceed. Informed consent was obtained and placed in the medical record.  PROCEDURE IN DETAIL:  The patient being Cedrik Heindl verified. Procedure being retrograde urethrogram and urethral dilation  was confirmed.  Procedure was carried out.  Time-out was performed. Intravenous antibiotics were administered.  General LMA anesthesia was introduced.  The patient was placed into a low lithotomy position. Sterile field was created by prepping and draping the patient's penis, perineum, and proximal thighs using iodine x3.  In addition to retrograde urethrogram, a 12-French Foley catheter was placed to the fossa navicularis and retrograde urethrogram was performed by injecting cysto contrast retrograde via this.  Retrograde urethrogram revealed a quite narrow caliber pendulous urethra with normal caliber bulbar urethra, this was felt to likely represent relatively long segment pendulous stricture.  As such, a 0.038 zip wire was advanced to the level of the urinary bladder over which a 30-French NephroMax balloon dilation apparatus was carefully advanced such that it traversed the fossa navicularis, this was inflated to a pressure of 22 atmospheres, held for 90 seconds, and then released.  This was then further advanced as it traversed the area of the bladder neck overlapping the previous area of dilation was re-expanded to 22 atmospheres for 90 seconds, then released.  Wire was kept in place and the obliteration apparatus was exchanged for rigid cystoscope. Endoscopic examination of the entire length of the urethra corroborated as expected, evidence of multifocal pendulous urethral stricture not involving the bulb, membranous, or prostatic urethra.  There was no evidence of bladder neck contracture or  obstruction at that level. Inspection of the urinary bladder revealed no diverticula, calcifications, or papular lesions.  Ureteral orifices were in normal anatomic position with efflux of clear urine bilaterally.  The cystoscope was then exchanged for a new 24-French Councill type catheter that was placed over the remaining safety wire.  10 mL of sterile water in the balloon was connected  to straight drain and procedure was terminated.  The patient tolerated the procedure well.  There were no immediate periprocedural complications.  The patient was taken to the postanesthesia care unit in stable condition.          ______________________________ Alexis Frock, MD     TM/MEDQ  D:  05/07/2015  T:  05/08/2015  Job:  [id number]

## 2015-05-12 ENCOUNTER — Encounter (HOSPITAL_BASED_OUTPATIENT_CLINIC_OR_DEPARTMENT_OTHER): Payer: Self-pay | Admitting: Urology

## 2015-07-03 ENCOUNTER — Emergency Department (HOSPITAL_COMMUNITY)
Admission: EM | Admit: 2015-07-03 | Discharge: 2015-07-03 | Disposition: A | Discharge status: Home / Self Care | Payer: Managed Care, Other (non HMO) | Attending: Emergency Medicine | Admitting: Emergency Medicine

## 2015-07-03 ENCOUNTER — Encounter (HOSPITAL_COMMUNITY): Payer: Self-pay

## 2015-07-03 DIAGNOSIS — Z87438 Personal history of other diseases of male genital organs: Secondary | ICD-10-CM | POA: Insufficient documentation

## 2015-07-03 DIAGNOSIS — F419 Anxiety disorder, unspecified: Secondary | ICD-10-CM | POA: Diagnosis not present

## 2015-07-03 DIAGNOSIS — Z8719 Personal history of other diseases of the digestive system: Secondary | ICD-10-CM | POA: Diagnosis not present

## 2015-07-03 DIAGNOSIS — Z79899 Other long term (current) drug therapy: Secondary | ICD-10-CM | POA: Diagnosis not present

## 2015-07-03 DIAGNOSIS — R339 Retention of urine, unspecified: Secondary | ICD-10-CM | POA: Insufficient documentation

## 2015-07-03 LAB — URINALYSIS, ROUTINE W REFLEX MICROSCOPIC
BILIRUBIN URINE: NEGATIVE
Glucose, UA: NEGATIVE mg/dL
Hgb urine dipstick: NEGATIVE
Ketones, ur: NEGATIVE mg/dL
Leukocytes, UA: NEGATIVE
Nitrite: NEGATIVE
PH: 6.5 (ref 5.0–8.0)
Protein, ur: NEGATIVE mg/dL
Specific Gravity, Urine: 1.01 (ref 1.005–1.030)
UROBILINOGEN UA: 0.2 mg/dL (ref 0.0–1.0)

## 2015-07-03 NOTE — ED Notes (Signed)
Pt reports that pain decreased from 8/10 to 0 after insertion of Foley catheter.  Approx 650 cc urine output drained immediately.

## 2015-07-03 NOTE — ED Provider Notes (Signed)
This chart was scribed for [place], DO by Douglas Taylor, ED Scribe. This patient was seen in room WA08/WA08 and the patient's care was started 1:25 AM.   TIME SEEN: 1:25 AM   CHIEF COMPLAINT:  Chief Complaint  Patient presents with  . Urinary Retention     HPI:  HPI Comments: Douglas Taylor is a 69 y.o. male with a PMHx of urethral stricture who presents to the Emergency Department here for urinary retention this evening. Pt states this has been intermittent and ongoing for 1 month with most recent episode starting at approximately 4:00 PM this afternoon. Typically when episodes come, pt is able to follow with his Urologist, Dr. Tresa Moore with improvement after foley placement. No recent fever, chills, new back pain, nausea, or vomitiing. No urinary or bowel incontinence. No weakness, numbness, or loss of sensation. [name] plans to follow up with his Urologist tomorrow. Last cystoscopy with dilation of urethral stricture performed 5/26. Second Cystoscopy planned and scheduled for next week at [facility]. Denies new medications. States he feels much better after Foley catheter placed.   ROS: See HPI Constitutional: no fever  Eyes: no drainage  ENT: no runny nose   Cardiovascular:  no chest pain  Resp: no SOB  GI: no vomiting GU: no dysuria. Positive for urinary retention Integumentary: no rash  Allergy: no hives  Musculoskeletal: no leg swelling  Neurological: no slurred speech ROS otherwise negative  PAST MEDICAL HISTORY/PAST SURGICAL HISTORY:  Past Medical History  Diagnosis Date  . Anxiety   . Urethral stricture     W/ RETENTION  . History of gastric ulcer     2004 APPROX.  Marland Kitchen GERD (gastroesophageal reflux disease)   . Seasonal allergies   . Wears glasses     MEDICATIONS:  Prior to Admission medications   Medication Sig Start Date End Date Taking? Authorizing Provider  acetaminophen (TYLENOL) 325 MG tablet Take 650 mg by mouth every 6 (six) hours as needed for  moderate pain.   Yes Historical Provider, MD  ALPRAZolam (XANAX) 0.5 MG tablet Take 0.75 mg by mouth daily with breakfast.    Yes Historical Provider, MD  HYDROcodone-acetaminophen (NORCO) 5-325 MG per tablet Take 1-2 tablets by mouth every 6 (six) hours as needed for moderate pain or severe pain. Post-operatively Patient not taking: Reported on 07/03/2015 05/07/15   Alexis Frock, MD  senna-docusate (SENOKOT-S) 8.6-50 MG per tablet Take 1 tablet by mouth 2 (two) times daily. While taking pain meds to prevent constipation Patient not taking: Reported on 07/03/2015 05/07/15   Alexis Frock, MD  sulfamethoxazole-trimethoprim (BACTRIM DS,SEPTRA DS) 800-160 MG per tablet Take 1 tablet by mouth 2 (two) times daily. X 5 days to prevent post-op infection Patient not taking: Reported on 07/03/2015 05/07/15   Alexis Frock, MD    ALLERGIES:  No Known Allergies  SOCIAL HISTORY:  History  Substance Use Topics  . Smoking status: Never Smoker   . Smokeless tobacco: Never Used  . Alcohol Use: No    FAMILY HISTORY: No family history on file.  EXAM: BP 170/110 mmHg  Pulse 88  Temp(Src) 97.9 F (36.6 C) (Oral)  Resp 22  SpO2 96% CONSTITUTIONAL: Alert and oriented and responds appropriately to questions. Well-appearing; well-nourished HEAD: Normocephalic EYES: Conjunctivae clear, PERRL ENT: normal nose; no rhinorrhea; moist mucous membranes; pharynx without lesions noted NECK: Supple, no meningismus, no LAD  CARD: RRR; S1 and S2 appreciated; no murmurs, no clicks, no rubs, no gallops RESP: Normal chest excursion  without splinting or tachypnea; breath sounds clear and equal bilaterally; no wheezes, no rhonchi, no rales, no hypoxia or respiratory distress, speaking full sentences ABD/GI: Normal bowel sounds; non-distended; soft, non-tender, no rebound, no guarding, no peritoneal signs BACK:  The back appears normal and is non-tender to palpation, there is no CVA tenderness, no midline spinal  tenderness or step-off or deformity, no saddle anesthesia EXT: Normal ROM in all joints; non-tender to palpation; no edema; normal capillary refill; no cyanosis, no calf tenderness or swelling    SKIN: Normal color for age and race; warm NEURO: Moves all extremities equally, sensation to light touch intact diffusely, cranial nerves II through XII intact, normal gait PSYCH: The patient's mood and manner are appropriate. Grooming and personal hygiene are appropriate. GU: Indwelling foley in place  MEDICAL DECISION MAKING: Patient here with urinary retention. Has history of the same and urethral stricture requiring dilation in the past. Has follow up with [facility] as well as a urologist at [facility]. Foley catheter placed with immediate drainage of approximate 650 and was of urine and complete resolution of symptoms.  He is neurologically intact. No back pain. No new medications. Urine shows no sign of infection. I feel he is safe to be discharged home. Discussed Foley catheter care and return precautions. He verbalizes understanding and is comfortable with this plan.   I personally performed the services described in this documentation, which was scribed in my presence. The recorded information has been reviewed and is accurate.   [place], DO 07/03/15 [id number]

## 2015-07-03 NOTE — ED Notes (Signed)
Patient is a patient of Dr. Tammi Klippel.  Reports he noticed a decrease in urinary output this afternoon which has gradually worsened.  Currently pacing, reports, "I'm miserable."

## 2015-07-03 NOTE — Discharge Instructions (Signed)
Acute Urinary Retention °Acute urinary retention is the temporary inability to urinate. °This is a common problem in older men. As men age their prostates become larger and block the flow of urine from the bladder. This is usually a problem that has come on gradually.  °HOME CARE INSTRUCTIONS °If you are sent home with a Foley catheter and a drainage system, you will need to discuss the best course of action with your health care provider. While the catheter is in, maintain a good intake of fluids. Keep the drainage bag emptied and lower than your catheter. This is so that contaminated urine will not flow back into your bladder, which could lead to a urinary tract infection. °There are two main types of drainage bags. One is a large bag that usually is used at night. It has a good capacity that will allow you to sleep through the night without having to empty it. The second type is called a leg bag. It has a smaller capacity, so it needs to be emptied more frequently. However, the main advantage is that it can be attached by a leg strap and can go underneath your clothing, allowing you the freedom to move about or leave your home. °Only take over-the-counter or prescription medicines for pain, discomfort, or fever as directed by your health care provider.  °SEEK MEDICAL CARE IF: °· You develop a low-grade fever. °· You experience spasms or leakage of urine with the spasms. °SEEK IMMEDIATE MEDICAL CARE IF:  °1. You develop chills or fever. °2. Your catheter stops draining urine. °3. Your catheter falls out. °4. You start to develop increased bleeding that does not respond to rest and increased fluid intake. °MAKE SURE YOU: °1. Understand these instructions. °2. Will watch your condition. °3. Will get help right away if you are not doing well or get worse. °Document Released: 03/06/2001 Document Revised: 12/03/2013 Document Reviewed: 05/09/2013 °ExitCare® Patient Information ©2015 [employer]. This information is  not intended to replace advice given to you by your health care provider. Make sure you discuss any questions you have with your health care provider. °Foley Catheter Care °A Foley catheter is a soft, flexible tube that is placed into the bladder to drain urine. A Foley catheter may be inserted if: °· You leak urine or are not able to control when you urinate (urinary incontinence). °· You are not able to urinate when you need to (urinary retention). °· You had prostate surgery or surgery on the genitals. °· You have certain medical conditions, such as multiple sclerosis, dementia, or a spinal cord injury. °If you are going home with a Foley catheter in place, follow the instructions below. °TAKING CARE OF THE CATHETER °5. Wash your hands with soap and water. °6. Using mild soap and warm water on a clean washcloth: °· Clean the area on your body closest to the catheter insertion site using a circular motion, moving away from the catheter. Never wipe toward the catheter because this could sweep bacteria up into the urethra and cause infection. °· Remove all traces of soap. Pat the area dry with a clean towel. For males, reposition the foreskin. °7. Attach the catheter to your leg so there is no tension on the catheter. Use adhesive tape or a leg strap. If you are using adhesive tape, remove any sticky residue left behind by the previous tape you used. °8. Keep the drainage bag below the level of the bladder, but keep it off the floor. °9. Check throughout   the day to be sure the catheter is working and urine is draining freely. Make sure the tubing does not become kinked. °10. Do not pull on the catheter or try to remove it. Pulling could damage internal tissues. °TAKING CARE OF THE DRAINAGE BAGS °You will be given two drainage bags to take home. One is a large overnight drainage bag, and the other is a smaller leg bag that fits underneath clothing. You may wear the overnight bag at any time, but you should never wear  the smaller leg bag at night. Follow the instructions below for how to empty, change, and clean your drainage bags. °Emptying the Drainage Bag °You must empty your drainage bag when it is  -½ full or at least 2-3 times a day. °4. Wash your hands with soap and water. °5. Keep the drainage bag below your hips, below the level of your bladder. This stops urine from going back into the tubing and into your bladder. °6. Hold the dirty bag over the toilet or a clean container. °7. Open the pour spout at the bottom of the bag and empty the urine into the toilet or container. Do not let the pour spout touch the toilet, container, or any other surface. Doing so can place bacteria on the bag, which can cause an infection. °8. Clean the pour spout with a gauze pad or cotton ball that has rubbing alcohol on it. °9. Close the pour spout. °10. Attach the bag to your leg with adhesive tape or a leg strap. °11. Wash your hands well. °Changing the Drainage Bag °Change your drainage bag once a month or sooner if it starts to smell bad or look dirty. Below are steps to follow when changing the drainage bag. °1. Wash your hands with soap and water. °2. Pinch off the rubber catheter so that urine does not spill out. °3. Disconnect the catheter tube from the drainage tube at the connection valve. Do not let the tubes touch any surface. °4. Clean the end of the catheter tube with an alcohol wipe. Use a different alcohol wipe to clean the end of the drainage tube. °5. Connect the catheter tube to the drainage tube of the clean drainage bag. °6. Attach the new bag to the leg with adhesive tape or a leg strap. Avoid attaching the new bag too tightly. °7. Wash your hands well. °Cleaning the Drainage Bag °1. Wash your hands with soap and water. °2. Wash the bag in warm, soapy water. °3. Rinse the bag thoroughly with warm water. °4. Fill the bag with a solution of white vinegar and water (1 cup vinegar to 1 qt warm water [.2 L vinegar to 1 L  warm water]). Close the bag and soak it for 30 minutes in the solution. °5. Rinse the bag with warm water. °6. Hang the bag to dry with the pour spout open and hanging downward. °7. Store the clean bag (once it is dry) in a clean plastic bag. °8. Wash your hands well. °PREVENTING INFECTION °· Wash your hands before and after handling your catheter. °· Take showers daily and wash the area where the catheter enters your body. Do not take baths. Replace wet leg straps with dry ones, if this applies. °· Do not use powders, sprays, or lotions on the genital area. Only use creams, lotions, or ointments as directed by your caregiver. °· For females, wipe from front to back after each bowel movement. °· Drink enough fluids to keep your urine   clear or pale yellow unless you have a fluid restriction. °· Do not let the drainage bag or tubing touch or lie on the floor. °· Wear cotton underwear to absorb moisture and to keep your skin drier. °SEEK MEDICAL CARE IF:  °· Your urine is cloudy or smells unusually bad. °· Your catheter becomes clogged. °· You are not draining urine into the bag or your bladder feels full. °· Your catheter starts to leak. °SEEK IMMEDIATE MEDICAL CARE IF:  °· You have pain, swelling, redness, or pus where the catheter enters the body. °· You have pain in the abdomen, legs, lower back, or bladder. °· You have a fever. °· You see blood fill the catheter, or your urine is pink or red. °· You have nausea, vomiting, or chills. °· Your catheter gets pulled out. °MAKE SURE YOU:  °· Understand these instructions. °· Will watch your condition. °· Will get help right away if you are not doing well or get worse. °Document Released: 11/28/2005 Document Revised: 04/14/2014 Document Reviewed: 11/19/2012 °ExitCare® Patient Information ©2015 [employer]. This information is not intended to replace advice given to you by your health care provider. Make sure you discuss any questions you have with your health care  provider. ° °

## 2015-07-04 LAB — URINE CULTURE: CULTURE: NO GROWTH

## 2016-09-19 DIAGNOSIS — K635 Polyp of colon: Secondary | ICD-10-CM | POA: Diagnosis not present

## 2016-09-19 DIAGNOSIS — K3189 Other diseases of stomach and duodenum: Secondary | ICD-10-CM | POA: Diagnosis not present

## 2016-09-19 DIAGNOSIS — K228 Other specified diseases of esophagus: Secondary | ICD-10-CM | POA: Diagnosis not present

## 2016-09-19 DIAGNOSIS — D123 Benign neoplasm of transverse colon: Secondary | ICD-10-CM | POA: Diagnosis not present

## 2016-09-19 DIAGNOSIS — Z1211 Encounter for screening for malignant neoplasm of colon: Secondary | ICD-10-CM | POA: Diagnosis not present

## 2016-09-19 DIAGNOSIS — K259 Gastric ulcer, unspecified as acute or chronic, without hemorrhage or perforation: Secondary | ICD-10-CM | POA: Diagnosis not present

## 2016-09-19 DIAGNOSIS — K219 Gastro-esophageal reflux disease without esophagitis: Secondary | ICD-10-CM | POA: Diagnosis not present

## 2016-09-19 DIAGNOSIS — D122 Benign neoplasm of ascending colon: Secondary | ICD-10-CM | POA: Diagnosis not present

## 2016-09-19 DIAGNOSIS — R1310 Dysphagia, unspecified: Secondary | ICD-10-CM | POA: Diagnosis not present

## 2016-09-19 DIAGNOSIS — D120 Benign neoplasm of cecum: Secondary | ICD-10-CM | POA: Diagnosis not present

## 2016-09-19 DIAGNOSIS — K2950 Unspecified chronic gastritis without bleeding: Secondary | ICD-10-CM | POA: Diagnosis not present

## 2016-09-19 DIAGNOSIS — B9681 Helicobacter pylori [H. pylori] as the cause of diseases classified elsewhere: Secondary | ICD-10-CM | POA: Diagnosis not present

## 2016-09-19 DIAGNOSIS — K209 Esophagitis, unspecified: Secondary | ICD-10-CM | POA: Diagnosis not present

## 2016-09-29 DIAGNOSIS — B9681 Helicobacter pylori [H. pylori] as the cause of diseases classified elsewhere: Secondary | ICD-10-CM | POA: Diagnosis not present

## 2016-09-29 DIAGNOSIS — K219 Gastro-esophageal reflux disease without esophagitis: Secondary | ICD-10-CM | POA: Diagnosis not present

## 2016-09-29 DIAGNOSIS — R1310 Dysphagia, unspecified: Secondary | ICD-10-CM | POA: Diagnosis not present

## 2016-09-29 DIAGNOSIS — K200 Eosinophilic esophagitis: Secondary | ICD-10-CM | POA: Diagnosis not present

## 2016-11-17 DIAGNOSIS — B9681 Helicobacter pylori [H. pylori] as the cause of diseases classified elsewhere: Secondary | ICD-10-CM | POA: Diagnosis not present

## 2017-01-17 DIAGNOSIS — H2513 Age-related nuclear cataract, bilateral: Secondary | ICD-10-CM | POA: Diagnosis not present

## 2017-01-17 DIAGNOSIS — H04123 Dry eye syndrome of bilateral lacrimal glands: Secondary | ICD-10-CM | POA: Diagnosis not present

## 2017-01-17 DIAGNOSIS — H40013 Open angle with borderline findings, low risk, bilateral: Secondary | ICD-10-CM | POA: Diagnosis not present

## 2017-01-17 DIAGNOSIS — H35371 Puckering of macula, right eye: Secondary | ICD-10-CM | POA: Diagnosis not present

## 2017-02-22 DIAGNOSIS — H2511 Age-related nuclear cataract, right eye: Secondary | ICD-10-CM | POA: Diagnosis not present

## 2017-02-22 DIAGNOSIS — H25811 Combined forms of age-related cataract, right eye: Secondary | ICD-10-CM | POA: Diagnosis not present

## 2017-03-16 DIAGNOSIS — R1310 Dysphagia, unspecified: Secondary | ICD-10-CM | POA: Diagnosis not present

## 2017-03-16 DIAGNOSIS — K200 Eosinophilic esophagitis: Secondary | ICD-10-CM | POA: Diagnosis not present

## 2017-03-16 DIAGNOSIS — K219 Gastro-esophageal reflux disease without esophagitis: Secondary | ICD-10-CM | POA: Diagnosis not present

## 2017-04-03 DIAGNOSIS — M25562 Pain in left knee: Secondary | ICD-10-CM | POA: Diagnosis not present

## 2017-04-27 DIAGNOSIS — S83242A Other tear of medial meniscus, current injury, left knee, initial encounter: Secondary | ICD-10-CM | POA: Diagnosis not present

## 2017-05-03 DIAGNOSIS — M25562 Pain in left knee: Secondary | ICD-10-CM | POA: Diagnosis not present

## 2017-05-11 DIAGNOSIS — S83242A Other tear of medial meniscus, current injury, left knee, initial encounter: Secondary | ICD-10-CM | POA: Diagnosis not present

## 2017-05-31 DIAGNOSIS — S83242D Other tear of medial meniscus, current injury, left knee, subsequent encounter: Secondary | ICD-10-CM | POA: Diagnosis not present

## 2017-06-01 ENCOUNTER — Other Ambulatory Visit (HOSPITAL_COMMUNITY): Payer: Self-pay | Admitting: Orthopedic Surgery

## 2017-06-01 DIAGNOSIS — S83242D Other tear of medial meniscus, current injury, left knee, subsequent encounter: Secondary | ICD-10-CM | POA: Diagnosis not present

## 2017-06-01 DIAGNOSIS — M7989 Other specified soft tissue disorders: Principal | ICD-10-CM

## 2017-06-01 DIAGNOSIS — M79605 Pain in left leg: Secondary | ICD-10-CM

## 2017-06-02 ENCOUNTER — Ambulatory Visit (HOSPITAL_COMMUNITY)
Admission: RE | Admit: 2017-06-02 | Discharge: 2017-06-02 | Disposition: A | Discharge status: Home / Self Care | Payer: Medicare HMO | Source: Ambulatory Visit | Attending: Orthopedic Surgery | Admitting: Orthopedic Surgery

## 2017-06-02 DIAGNOSIS — M7989 Other specified soft tissue disorders: Secondary | ICD-10-CM

## 2017-06-02 DIAGNOSIS — M79605 Pain in left leg: Secondary | ICD-10-CM | POA: Insufficient documentation

## 2017-06-02 NOTE — Progress Notes (Signed)
*  Preliminary Results* Left lower extremity venous duplex completed. Left lower extremity is negative for deep vein thrombosis. There is no evidence of left Baker's cyst.  06/02/2017 3:04 PM  Maudry Mayhew, BS, RVT, RDCS, RDMS

## 2017-06-05 DIAGNOSIS — S83282A Other tear of lateral meniscus, current injury, left knee, initial encounter: Secondary | ICD-10-CM | POA: Diagnosis not present

## 2017-06-05 DIAGNOSIS — M94262 Chondromalacia, left knee: Secondary | ICD-10-CM | POA: Diagnosis not present

## 2017-06-05 DIAGNOSIS — G8918 Other acute postprocedural pain: Secondary | ICD-10-CM | POA: Diagnosis not present

## 2017-06-05 DIAGNOSIS — S83232A Complex tear of medial meniscus, current injury, left knee, initial encounter: Secondary | ICD-10-CM | POA: Diagnosis not present

## 2017-06-05 DIAGNOSIS — S83242A Other tear of medial meniscus, current injury, left knee, initial encounter: Secondary | ICD-10-CM | POA: Diagnosis not present

## 2017-06-15 DIAGNOSIS — S83282D Other tear of lateral meniscus, current injury, left knee, subsequent encounter: Secondary | ICD-10-CM | POA: Diagnosis not present

## 2017-06-15 DIAGNOSIS — S83242D Other tear of medial meniscus, current injury, left knee, subsequent encounter: Secondary | ICD-10-CM | POA: Diagnosis not present

## 2017-07-13 DIAGNOSIS — E784 Other hyperlipidemia: Secondary | ICD-10-CM | POA: Diagnosis not present

## 2017-07-13 DIAGNOSIS — R8299 Other abnormal findings in urine: Secondary | ICD-10-CM | POA: Diagnosis not present

## 2017-07-13 DIAGNOSIS — R7309 Other abnormal glucose: Secondary | ICD-10-CM | POA: Diagnosis not present

## 2017-07-13 DIAGNOSIS — N390 Urinary tract infection, site not specified: Secondary | ICD-10-CM | POA: Diagnosis not present

## 2017-07-13 DIAGNOSIS — S83242D Other tear of medial meniscus, current injury, left knee, subsequent encounter: Secondary | ICD-10-CM | POA: Diagnosis not present

## 2017-07-13 DIAGNOSIS — Z125 Encounter for screening for malignant neoplasm of prostate: Secondary | ICD-10-CM | POA: Diagnosis not present

## 2017-07-13 DIAGNOSIS — S83282D Other tear of lateral meniscus, current injury, left knee, subsequent encounter: Secondary | ICD-10-CM | POA: Diagnosis not present

## 2017-07-13 DIAGNOSIS — I10 Essential (primary) hypertension: Secondary | ICD-10-CM | POA: Diagnosis not present

## 2017-07-20 DIAGNOSIS — F1320 Sedative, hypnotic or anxiolytic dependence, uncomplicated: Secondary | ICD-10-CM | POA: Diagnosis not present

## 2017-07-20 DIAGNOSIS — I454 Nonspecific intraventricular block: Secondary | ICD-10-CM | POA: Diagnosis not present

## 2017-07-20 DIAGNOSIS — M1990 Unspecified osteoarthritis, unspecified site: Secondary | ICD-10-CM | POA: Diagnosis not present

## 2017-07-20 DIAGNOSIS — N401 Enlarged prostate with lower urinary tract symptoms: Secondary | ICD-10-CM | POA: Diagnosis not present

## 2017-07-20 DIAGNOSIS — F418 Other specified anxiety disorders: Secondary | ICD-10-CM | POA: Diagnosis not present

## 2017-07-20 DIAGNOSIS — K219 Gastro-esophageal reflux disease without esophagitis: Secondary | ICD-10-CM | POA: Diagnosis not present

## 2017-07-20 DIAGNOSIS — R7309 Other abnormal glucose: Secondary | ICD-10-CM | POA: Diagnosis not present

## 2017-07-20 DIAGNOSIS — Z0000 Encounter for general adult medical examination without abnormal findings: Secondary | ICD-10-CM | POA: Diagnosis not present

## 2017-07-20 DIAGNOSIS — I10 Essential (primary) hypertension: Secondary | ICD-10-CM | POA: Diagnosis not present

## 2017-09-04 ENCOUNTER — Other Ambulatory Visit (HOSPITAL_COMMUNITY): Payer: Self-pay | Admitting: Orthopedic Surgery

## 2017-09-04 DIAGNOSIS — M7989 Other specified soft tissue disorders: Principal | ICD-10-CM

## 2017-09-04 DIAGNOSIS — M25561 Pain in right knee: Secondary | ICD-10-CM | POA: Diagnosis not present

## 2017-09-04 DIAGNOSIS — M79604 Pain in right leg: Secondary | ICD-10-CM

## 2017-09-05 ENCOUNTER — Ambulatory Visit (HOSPITAL_COMMUNITY)
Admission: RE | Admit: 2017-09-05 | Discharge: 2017-09-05 | Disposition: A | Discharge status: Home / Self Care | Payer: Medicare HMO | Source: Ambulatory Visit | Attending: Orthopedic Surgery | Admitting: Orthopedic Surgery

## 2017-09-05 DIAGNOSIS — M7989 Other specified soft tissue disorders: Secondary | ICD-10-CM | POA: Insufficient documentation

## 2017-09-05 DIAGNOSIS — M79604 Pain in right leg: Secondary | ICD-10-CM | POA: Diagnosis not present

## 2017-09-05 NOTE — Progress Notes (Signed)
VASCULAR LAB PRELIMINARY  PRELIMINARY  PRELIMINARY  PRELIMINARY  Right lower extremity venous duplex completed.    Preliminary report:  Right:  No evidence of DVT, superficial thrombosis, or Baker's cyst.  Douglas Taylor, RVS 09/05/2017, 9:18 AM

## 2017-09-18 DIAGNOSIS — M545 Low back pain: Secondary | ICD-10-CM | POA: Diagnosis not present

## 2017-09-28 DIAGNOSIS — M545 Low back pain: Secondary | ICD-10-CM | POA: Diagnosis not present

## 2017-10-06 DIAGNOSIS — Z23 Encounter for immunization: Secondary | ICD-10-CM | POA: Diagnosis not present

## 2017-10-10 DIAGNOSIS — H04123 Dry eye syndrome of bilateral lacrimal glands: Secondary | ICD-10-CM | POA: Diagnosis not present

## 2017-10-10 DIAGNOSIS — Z961 Presence of intraocular lens: Secondary | ICD-10-CM | POA: Diagnosis not present

## 2017-10-10 DIAGNOSIS — H40013 Open angle with borderline findings, low risk, bilateral: Secondary | ICD-10-CM | POA: Diagnosis not present

## 2017-10-10 DIAGNOSIS — H25812 Combined forms of age-related cataract, left eye: Secondary | ICD-10-CM | POA: Diagnosis not present

## 2017-10-30 ENCOUNTER — Encounter: Payer: Self-pay | Admitting: Neurology

## 2017-10-30 DIAGNOSIS — R262 Difficulty in walking, not elsewhere classified: Secondary | ICD-10-CM | POA: Diagnosis not present

## 2017-10-30 DIAGNOSIS — M79605 Pain in left leg: Secondary | ICD-10-CM | POA: Diagnosis not present

## 2017-10-30 DIAGNOSIS — M79604 Pain in right leg: Secondary | ICD-10-CM | POA: Diagnosis not present

## 2018-01-08 ENCOUNTER — Other Ambulatory Visit: Payer: Medicare Other

## 2018-01-08 ENCOUNTER — Ambulatory Visit (INDEPENDENT_AMBULATORY_CARE_PROVIDER_SITE_OTHER): Payer: Medicare HMO | Admitting: Neurology

## 2018-01-08 ENCOUNTER — Encounter: Payer: Self-pay | Admitting: Neurology

## 2018-01-08 VITALS — BP 128/82 | HR 83 | Ht 67.0 in | Wt 175.8 lb

## 2018-01-08 DIAGNOSIS — M79604 Pain in right leg: Secondary | ICD-10-CM

## 2018-01-08 DIAGNOSIS — M79605 Pain in left leg: Secondary | ICD-10-CM | POA: Diagnosis not present

## 2018-01-08 DIAGNOSIS — Z1321 Encounter for screening for nutritional disorder: Secondary | ICD-10-CM | POA: Diagnosis not present

## 2018-01-08 DIAGNOSIS — R292 Abnormal reflex: Secondary | ICD-10-CM | POA: Diagnosis not present

## 2018-01-08 MED ORDER — GABAPENTIN 100 MG PO CAPS: ORAL_CAPSULE | ORAL | 0 refills | Status: DC

## 2018-01-08 NOTE — Patient Instructions (Addendum)
1.  We will first check MRI of thoracic spine with and without contrast and B12 level.  Further recommendations pending results. 2.  To help with pain, start gabapentin 100mg .  Take 1 capsule at bedtime for 1 week, then 1 capsule in morning and 1 capsule at bedtime for 1 week, then 1 capsule 3 times daily 3.  Follow up in 3 months.

## 2018-01-08 NOTE — Progress Notes (Signed)
NEUROLOGY CONSULTATION NOTE  Douglas Taylor MRN: [id number] DOB: [birth year]  Referring provider: Dr. Ron Agee Primary care provider: Dr. Virgina Jock  Reason for consult:  Bilateral leg pain  HISTORY OF PRESENT ILLNESS: Douglas Taylor is a 72 year old right-handed male anxiety who presents for paresthesia and ataxia.  History supplemented by orthopedist's notes.  In June 2018, he had a left knee scope without complication.  Beginning in September, he has had progressive pain in both legs.  He reports a deep burning pain.  Initially, it radiated down to the dorsum of both feet but now it only involves the shin, usually from the knee down but occasionally just above the knees.  There is a fairly constant pain that is often dull but he has fluctuations of severity throughout the day.  Sometimes he reports severe knee pain on the right side, sometimes the left side and sometimes both at the same time.  He denies weakness.  He has mild "tinge" in the mid low back but otherwise no significant back pain.  He denies neck pain or pain, numbness or weakness in the upper extremities.  Due to the pain, his gait is often affected, causing him to hobble.  He reports no change in bowel or bladder function.    He denies any back or neck injury.  He had an MRI of the lumbar spine on 09/28/17, which, as per report, demonstrated mild lumbar spondylosis with no evidence of neural foraminal or central canal stenosis.  Venous ultrasound for DVT was negative.    He tried gabapentin 300mg  at bedtime for one night but stopped because it made him drowsy the next day.  He sometimes takes tramadol.  PAST MEDICAL HISTORY: Past Medical History:  Diagnosis Date  . Anxiety   . GERD (gastroesophageal reflux disease)   . History of gastric ulcer    2004 APPROX.  . Seasonal allergies   . Urethral stricture    W/ RETENTION  . Wears glasses     PAST SURGICAL HISTORY: Past Surgical History:  Procedure Laterality Date    . CYSTOSCOPY WITH RETROGRADE URETHROGRAM N/A 05/07/2015   Procedure: CYSTOSCOPY WITH RETROGRADE URETHROGRAM;  Surgeon: Alexis Frock, MD;  Location: [facility];  Service: Urology;  Laterality: N/A;  . CYSTOSCOPY WITH URETHRAL DILATATION N/A 05/07/2015   Procedure: CYSTOSCOPY WITH  BALLOON URETHRAL DILATATION;  Surgeon: Alexis Frock, MD;  Location: [facility];  Service: Urology;  Laterality: N/A;  . INGUINAL HERNIA REPAIR Right 04/10/2015   Procedure: LAPAROSCOPIC INGUINAL HERNIA REPAIR WITH MESH;  Surgeon: Ralene Ok, MD;  Location: [facility];  Service: General;  Laterality: Right;  . INSERTION OF MESH Right 04/10/2015   Procedure: INSERTION OF MESH;  Surgeon: Ralene Ok, MD;  Location: [facility];  Service: General;  Laterality: Right;  . KNEE ARTHROSCOPY Left     MEDICATIONS: Current Outpatient Medications on File Prior to Visit  Medication Sig Dispense Refill  . acetaminophen (TYLENOL) 325 MG tablet Take 650 mg by mouth every 6 (six) hours as needed for moderate pain.    Marland Kitchen ALPRAZolam (XANAX) 0.5 MG tablet Take 0.75 mg by mouth daily with breakfast.     . HYDROcodone-acetaminophen (NORCO) 5-325 MG per tablet Take 1-2 tablets by mouth every 6 (six) hours as needed for moderate pain or severe pain. Post-operatively (Patient not taking: Reported on 07/03/2015) 30 tablet 0  . senna-docusate (SENOKOT-S) 8.6-50 MG per tablet Take 1 tablet by mouth 2 (two) times daily. While taking  pain meds to prevent constipation (Patient not taking: Reported on 07/03/2015) 30 tablet 0  . sulfamethoxazole-trimethoprim (BACTRIM DS,SEPTRA DS) 800-160 MG per tablet Take 1 tablet by mouth 2 (two) times daily. X 5 days to prevent post-op infection (Patient not taking: Reported on 07/03/2015) 10 tablet 0  . traMADol (ULTRAM) 50 MG tablet Take 50 mg by mouth every 6 (six) hours as needed.     No current facility-administered medications on file prior to visit.     ALLERGIES: No Known  Allergies  FAMILY HISTORY: Family History  Problem Relation Age of Onset  . Heart disease Mother   . Multiple sclerosis Sister     SOCIAL HISTORY: Social History   Socioeconomic History  . Marital status: Married    Spouse name: Caren Griffins   . Number of children: 1  . Years of education: Not on file  . Highest education level: 12th grade  Social Needs  . Financial resource strain: Not on file  . Food insecurity - worry: Not on file  . Food insecurity - inability: Not on file  . Transportation needs - medical: Not on file  . Transportation needs - non-medical: Not on file  Occupational History  . Occupation: retired  Tobacco Use  . Smoking status: Never Smoker  . Smokeless tobacco: Never Used  Substance and Sexual Activity  . Alcohol use: No  . Drug use: No  . Sexual activity: Not on file  Other Topics Concern  . Not on file  Social History Narrative   Married, livves with wife in a 1 story house. Patient is right-handed. Drinks 2 cans of soda a day and an occasional glass of tea with meals. 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    REVIEW OF SYSTEMS: Constitutional: No fevers, chills, or sweats, no generalized fatigue, change in appetite Eyes: No visual changes, double vision, eye pain Ear, nose and throat: No hearing loss, ear pain, nasal congestion, sore throat Cardiovascular: No chest pain, palpitations Respiratory:  No shortness of breath at rest or with exertion, wheezes GastrointestinaI: No nausea, vomiting, diarrhea, abdominal pain, fecal incontinence Genitourinary:  No dysuria, urinary retention or frequency Musculoskeletal:  No neck pain, back pain Integumentary: No rash, pruritus, skin lesions Neurological: as above Psychiatric: No depression, insomnia, anxiety Endocrine: No palpitations, fatigue, diaphoresis, mood swings, change in appetite, change in weight, increased  thirst Hematologic/Lymphatic:  No purpura, petechiae. Allergic/Immunologic: no itchy/runny eyes, nasal congestion, recent allergic reactions, rashes  PHYSICAL EXAM: Vitals:   01/08/18 0952  BP: 128/82  Pulse: 83  SpO2: 96%   General: No acute distress.  Patient appears well-groomed.  Head:  Normocephalic/atraumatic Eyes:  fundi examined but not visualized Neck: supple, no paraspinal tenderness, full range of motion Back: No paraspinal tenderness Heart: regular rate and rhythm Lungs: Clear to auscultation bilaterally. Vascular: No carotid bruits. Neurological Exam: Mental status: alert and oriented to person, place, and time, recent and remote memory intact, fund of knowledge intact, attention and concentration intact, speech fluent and not dysarthric, language intact. Cranial nerves: CN I: not tested CN II: pupils equal, round and reactive to light, visual fields intact CN III, IV, VI:  full range of motion, no nystagmus, no ptosis CN V: facial sensation intact CN VII: upper and lower face symmetric CN VIII: hearing intact CN IX, X: gag intact, uvula midline CN XI: sternocleidomastoid and trapezius muscles intact CN XII: tongue midline Bulk & Tone: Possible increased tone  in lower extremities but suspect patient is not relaxing.. Motor:  5/5 throughout  Sensation:  Pinprick and vibration sensation intact. Deep Tendon Reflexes:  2+ throughout except 3+ in patellars, toes downgoing.  Finger to nose testing:  Without dysmetria.  Heel to shin:  Without dysmetria.  Gait:  Mildly wide-based, left limp.  Able to turn but some difficulty with tandem walk. Romberg negative.  IMPRESSION: Bilateral lower anterior leg dysesthesias.  MRI of lumbar spine does not reveal any structural explanation.  He is hyperreflexic in the patellars, which may be physiologic cord abnormality should be considered.  PLAN: 1.  We will check MRI of thoracic spine with and without contrast to evaluate for  cord pathology.  If unremarkable, consider MRI of cervical spine. 2.  Check B12 level 3.  Will restart gabapentin but at lower dose, 100mg  at bedtime and titrating to three times daily.  If patient cannot tolerate, consider Lyrica. 4.  Follow up in 3 months.  Thank you for allowing me to take part in the care of this patient.  Metta Clines, DO  CC:  Shon Baton, MD  Laroy Apple, MD

## 2018-01-09 LAB — VITAMIN B12: Vitamin B-12: 256 pg/mL (ref 200–1100)

## 2018-01-09 LAB — TIQ-NTM

## 2018-01-10 ENCOUNTER — Telehealth: Payer: Self-pay

## 2018-01-10 NOTE — Telephone Encounter (Signed)
-----   Message from Pieter Partridge, DO sent at 01/09/2018  8:04 AM EST ----- B12 level is in the low normal range, which may still potentially cause nerve-type pain.  He can take over the counter B12 1000 mcg daily and see if symptoms improve at all.

## 2018-01-10 NOTE — Telephone Encounter (Signed)
Called and spoke with Pt, advsd him of lab results and B12 supplement recommendation. Pt wanted Dr Tomi Likens to know, the gabapentin 100mg  QHS has made a huge difference and he is pleased. He questioned if he should even increase to BID. Advsd him he can try 100mg  BID, if problems with sedation, go back to 100mg  QHS. Pt will call with any questions or concerns.

## 2018-01-15 ENCOUNTER — Telehealth: Payer: Self-pay | Admitting: Neurology

## 2018-01-15 NOTE — Telephone Encounter (Signed)
Pt left a voicemail message saying he has not been contacted to schedule his MRI yet

## 2018-01-16 NOTE — Telephone Encounter (Signed)
Called Pt to advise orders were sent to [facility] and he may contact them directly at [phone]

## 2018-01-22 ENCOUNTER — Ambulatory Visit
Admission: RE | Admit: 2018-01-22 | Discharge: 2018-01-22 | Disposition: A | Discharge status: Home / Self Care | Payer: Medicare Other | Source: Ambulatory Visit | Attending: Neurology | Admitting: Neurology

## 2018-01-22 DIAGNOSIS — R292 Abnormal reflex: Secondary | ICD-10-CM

## 2018-01-22 DIAGNOSIS — M79605 Pain in left leg: Principal | ICD-10-CM

## 2018-01-22 DIAGNOSIS — M79604 Pain in right leg: Secondary | ICD-10-CM

## 2018-01-22 DIAGNOSIS — M4804 Spinal stenosis, thoracic region: Secondary | ICD-10-CM | POA: Diagnosis not present

## 2018-01-22 MED ORDER — GADOBENATE DIMEGLUMINE 529 MG/ML IV SOLN
15.0000 mL | Freq: Once | INTRAVENOUS | Status: AC | PRN
  Administered 2018-01-22: 15 mL via INTRAVENOUS

## 2018-01-23 ENCOUNTER — Telehealth: Payer: Self-pay

## 2018-01-23 DIAGNOSIS — R292 Abnormal reflex: Secondary | ICD-10-CM

## 2018-01-23 DIAGNOSIS — R2681 Unsteadiness on feet: Secondary | ICD-10-CM

## 2018-01-23 NOTE — Telephone Encounter (Signed)
-----   Message from Pieter Partridge, DO sent at 01/23/2018  6:43 AM EST ----- MRI does not show any pinching of the spinal cord in the back.  I would like to check MRI of cervical spine without contrast to evaluate for unsteady gait and hyperreflexia.

## 2018-01-23 NOTE — Telephone Encounter (Signed)
Called and spoke to Pt, advsd of T spine MRI results, and of C spine MRI recommendation.

## 2018-02-05 ENCOUNTER — Ambulatory Visit: Payer: Medicare Other | Admitting: Neurology

## 2018-02-07 ENCOUNTER — Other Ambulatory Visit: Payer: Medicare Other

## 2018-02-09 ENCOUNTER — Ambulatory Visit
Admission: RE | Admit: 2018-02-09 | Discharge: 2018-02-09 | Disposition: A | Discharge status: Home / Self Care | Payer: Medicare HMO | Source: Ambulatory Visit | Attending: Neurology | Admitting: Neurology

## 2018-02-09 ENCOUNTER — Telehealth: Payer: Self-pay | Admitting: Neurology

## 2018-02-09 DIAGNOSIS — M4802 Spinal stenosis, cervical region: Secondary | ICD-10-CM | POA: Diagnosis not present

## 2018-02-09 DIAGNOSIS — R2681 Unsteadiness on feet: Secondary | ICD-10-CM

## 2018-02-09 DIAGNOSIS — R292 Abnormal reflex: Secondary | ICD-10-CM

## 2018-02-09 NOTE — Telephone Encounter (Signed)
Patient made aware.

## 2018-02-09 NOTE — Telephone Encounter (Signed)
-----   Message from Pieter Partridge, DO sent at 02/09/2018 12:36 PM EST ----- MRI of cervical spine does not reveal any abnormality of spinal cord

## 2018-02-16 ENCOUNTER — Other Ambulatory Visit: Payer: Self-pay | Admitting: Urology

## 2018-02-16 ENCOUNTER — Encounter (HOSPITAL_COMMUNITY): Payer: Self-pay | Admitting: Anesthesiology

## 2018-02-16 ENCOUNTER — Encounter (HOSPITAL_COMMUNITY): Admission: RE | Payer: Self-pay | Source: Ambulatory Visit

## 2018-02-16 ENCOUNTER — Ambulatory Visit (HOSPITAL_COMMUNITY): Admission: RE | Admit: 2018-02-16 | Payer: Medicare HMO | Source: Ambulatory Visit | Admitting: Urology

## 2018-02-16 DIAGNOSIS — R339 Retention of urine, unspecified: Secondary | ICD-10-CM | POA: Diagnosis not present

## 2018-02-16 DIAGNOSIS — N35819 Other urethral stricture, male, unspecified site: Secondary | ICD-10-CM | POA: Diagnosis not present

## 2018-02-16 SURGERY — CYSTOSCOPY, WITH URETHRAL DILATION
Anesthesia: General | Site: Urethra

## 2018-02-16 NOTE — Anesthesia Preprocedure Evaluation (Deleted)
Anesthesia Evaluation  Patient identified by MRN, date of birth, ID band Patient awake    Reviewed: Allergy & Precautions, NPO status , Patient's Chart, lab work & pertinent test results  Airway Mallampati: II  TM Distance: >3 FB Neck ROM: Full    Dental no notable dental hx. (+) Dental Advisory Given   Pulmonary neg pulmonary ROS,    Pulmonary exam normal breath sounds clear to auscultation       Cardiovascular negative cardio ROS Normal cardiovascular exam Rhythm:Regular Rate:Normal     Neuro/Psych Anxiety negative neurological ROS     GI/Hepatic Neg liver ROS, GERD  Controlled,  Endo/Other  negative endocrine ROS  Renal/GU negative Renal ROS   Urethral stricture    Musculoskeletal negative musculoskeletal ROS (+)   Abdominal   Peds  Hematology negative hematology ROS (+)   Anesthesia Other Findings   Reproductive/Obstetrics                             Anesthesia Physical  Anesthesia Plan  ASA: II  Anesthesia Plan: General   Post-op Pain Management:    Induction: Intravenous  PONV Risk Score and Plan: 3 and Treatment may vary due to age or medical condition, Ondansetron and Dexamethasone  Airway Management Planned: LMA  Additional Equipment: None  Intra-op Plan:   Post-operative Plan: Extubation in OR  Informed Consent: I have reviewed the patients History and Physical, chart, labs and discussed the procedure including the risks, benefits and alternatives for the proposed anesthesia with the patient or authorized representative who has indicated his/her understanding and acceptance.   Dental advisory given  Plan Discussed with: CRNA  Anesthesia Plan Comments:         Anesthesia Quick Evaluation

## 2018-03-06 DIAGNOSIS — R339 Retention of urine, unspecified: Secondary | ICD-10-CM | POA: Diagnosis not present

## 2018-03-27 ENCOUNTER — Ambulatory Visit: Payer: Medicare HMO | Admitting: Neurology

## 2018-03-27 ENCOUNTER — Encounter: Payer: Self-pay | Admitting: Neurology

## 2018-03-27 VITALS — BP 136/90 | HR 68 | Ht 67.0 in | Wt 181.2 lb

## 2018-03-27 DIAGNOSIS — M79604 Pain in right leg: Secondary | ICD-10-CM | POA: Diagnosis not present

## 2018-03-27 DIAGNOSIS — M79605 Pain in left leg: Secondary | ICD-10-CM | POA: Diagnosis not present

## 2018-03-27 MED ORDER — GABAPENTIN 100 MG PO CAPS: 100.0000 mg | ORAL_CAPSULE | Freq: Every day | ORAL | 5 refills | Status: DC

## 2018-03-27 NOTE — Patient Instructions (Signed)
1.  I refilled the gabapentin for you 2.  Follow up in 6 months.

## 2018-03-27 NOTE — Progress Notes (Signed)
NEUROLOGY FOLLOW UP OFFICE NOTE  Douglas Taylor [id number]  HISTORY OF PRESENT ILLNESS: Douglas Taylor is a 72 year old right-handed male anxiety who follows up for bilateral leg pain.  UPDATE: For pain, he was started on gabapentin 100mg  at bedtime, which seems to be effective.  He is able to walk and stand for extended periods of time.  Higher doses caused drowsiness.   B12 was 256, so he was advised to start 1042mcg daily. Due to hyperreflexia on exam, he had MRI of thoracic spine with and without contrast on 01/22/18, which was personally reviewed and reevaled multiplevel spondylosis with central disc protrusion at T10-11 and bilateral foraminal narrowing but no cord compression.  An MRI of the cervical spine from 02/09/18 was also personally reviewed and revealed advanced degenerative disc and facet disease but also without cord compression.     HISTORY: In June 2018, he had a left knee scope without complication.  Beginning in September, he has had progressive pain in both legs.  He reports a deep burning pain.  Initially, it radiated down to the dorsum of both feet but now it only involves the shin, usually from the knee down but occasionally just above the knees.  There is a fairly constant pain that is often dull but he has fluctuations of severity throughout the day.  Sometimes he reports severe knee pain on the right side, sometimes the left side and sometimes both at the same time.  He denies weakness.  He has mild "tinge" in the mid low back but otherwise no significant back pain.  He denies neck pain or pain, numbness or weakness in the upper extremities.  Due to the pain, his gait is often affected, causing him to hobble.  He reports no change in bowel or bladder function.     He denies any back or neck injury.   He had an MRI of the lumbar spine on 09/28/17, which, as per report, demonstrated mild lumbar spondylosis with no evidence of neural foraminal or central canal stenosis.   Venous ultrasound for DVT was negative.     He tried gabapentin 300mg  at bedtime for one night but stopped because it made him drowsy the next day.  He sometimes takes tramadol.  PAST MEDICAL HISTORY: Past Medical History:  Diagnosis Date  . Anxiety   . GERD (gastroesophageal reflux disease)   . History of gastric ulcer    2004 APPROX.  . Seasonal allergies   . Urethral stricture    W/ RETENTION  . Wears glasses     MEDICATIONS: Current Outpatient Medications on File Prior to Visit  Medication Sig Dispense Refill  . acetaminophen (TYLENOL) 325 MG tablet Take 650 mg by mouth every 6 (six) hours as needed for moderate pain.    Marland Kitchen ALPRAZolam (XANAX) 0.5 MG tablet Take 0.75 mg by mouth daily with breakfast.     . HYDROcodone-acetaminophen (NORCO) 5-325 MG per tablet Take 1-2 tablets by mouth every 6 (six) hours as needed for moderate pain or severe pain. Post-operatively (Patient not taking: Reported on 07/03/2015) 30 tablet 0  . senna-docusate (SENOKOT-S) 8.6-50 MG per tablet Take 1 tablet by mouth 2 (two) times daily. While taking pain meds to prevent constipation (Patient not taking: Reported on 07/03/2015) 30 tablet 0  . sulfamethoxazole-trimethoprim (BACTRIM DS,SEPTRA DS) 800-160 MG per tablet Take 1 tablet by mouth 2 (two) times daily. X 5 days to prevent post-op infection (Patient not taking: Reported on 07/03/2015) 10 tablet 0  .  traMADol (ULTRAM) 50 MG tablet Take 50 mg by mouth every 6 (six) hours as needed.     No current facility-administered medications on file prior to visit.     ALLERGIES: No Known Allergies  FAMILY HISTORY: Family History  Problem Relation Age of Onset  . Heart disease Mother   . Multiple sclerosis Sister     SOCIAL HISTORY: Social History   Socioeconomic History  . Marital status: Married    Spouse name: Caren Griffins   . Number of children: 1  . Years of education: Not on file  . Highest education level: 12th grade  Occupational History  .  Occupation: retired  Scientific laboratory technician  . Financial resource strain: Not on file  . Food insecurity:    Worry: Not on file    Inability: Not on file  . Transportation needs:    Medical: Not on file    Non-medical: Not on file  Tobacco Use  . Smoking status: Never Smoker  . Smokeless tobacco: Never Used  Substance and Sexual Activity  . Alcohol use: No  . Drug use: No  . Sexual activity: Not on file  Lifestyle  . Physical activity:    Days per week: Not on file    Minutes per session: Not on file  . Stress: Not on file  Relationships  . Social connections:    Talks on phone: Not on file    Gets together: Not on file    Attends religious service: Not on file    Active member of club or organization: Not on file    Attends meetings of clubs or organizations: Not on file    Relationship status: Not on file  . Intimate partner violence:    Fear of current or ex partner: Not on file    Emotionally abused: Not on file    Physically abused: Not on file    Forced sexual activity: Not on file  Other Topics Concern  . Not on file  Social History Narrative   Married, livves with wife in a 1 story house. Patient is right-handed. Drinks 2 cans of soda a day and an occasional glass of tea with meals. 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    REVIEW OF SYSTEMS: Constitutional: No fevers, chills, or sweats, no generalized fatigue, change in appetite Eyes: No visual changes, double vision, eye pain Ear, nose and throat: No hearing loss, ear pain, nasal congestion, sore throat Cardiovascular: No chest pain, palpitations Respiratory:  No shortness of breath at rest or with exertion, wheezes GastrointestinaI: No nausea, vomiting, diarrhea, abdominal pain, fecal incontinence Genitourinary:  No dysuria, urinary retention or frequency Musculoskeletal:  No neck pain, back pain Integumentary: No rash, pruritus, skin  lesions Neurological: as above Psychiatric: No depression, insomnia, anxiety Endocrine: No palpitations, fatigue, diaphoresis, mood swings, change in appetite, change in weight, increased thirst Hematologic/Lymphatic:  No purpura, petechiae. Allergic/Immunologic: no itchy/runny eyes, nasal congestion, recent allergic reactions, rashes  PHYSICAL EXAM: Vitals:   03/27/18 1111  BP: 136/90  Pulse: 68  SpO2: 98%   General: No acute distress.  Patient appears well-groomed.  normal body habitus. Head:  Normocephalic/atraumatic Eyes:  Fundi examined but not visualized Neurological Exam: alert and oriented to person, place, and time. Attention span and concentration intact, recent and remote memory intact, fund of knowledge intact.  Speech fluent and not dysarthric, language intact.  CN II-XII intact. Bulk and tone normal, muscle strength  5/5 throughout.  Sensation to light touch, temperature and vibration intact.  Deep tendon reflexes 2+ throughout, toes downgoing.  Finger to nose and heel to shin testing intact.  Gait normal, Romberg negative.  IMPRESSION: Bilateral leg pain  PLAN: Continue gabapentin 100mg  at bedtime and B12 Follow up in 6 months.  15 minutes spent face to face with patient, over 50% spent discussing management.  Metta Clines, DO  CC:  Dr. Virgina Jock

## 2018-04-06 DIAGNOSIS — F419 Anxiety disorder, unspecified: Secondary | ICD-10-CM | POA: Diagnosis not present

## 2018-04-06 DIAGNOSIS — Z6829 Body mass index (BMI) 29.0-29.9, adult: Secondary | ICD-10-CM | POA: Diagnosis not present

## 2018-04-06 DIAGNOSIS — F1320 Sedative, hypnotic or anxiolytic dependence, uncomplicated: Secondary | ICD-10-CM | POA: Diagnosis not present

## 2018-04-06 DIAGNOSIS — E663 Overweight: Secondary | ICD-10-CM | POA: Diagnosis not present

## 2018-04-06 DIAGNOSIS — I10 Essential (primary) hypertension: Secondary | ICD-10-CM | POA: Diagnosis not present

## 2018-04-09 ENCOUNTER — Encounter: Payer: Self-pay | Admitting: Neurology

## 2018-04-20 ENCOUNTER — Ambulatory Visit: Payer: Medicare Other | Admitting: Neurology

## 2018-04-30 DIAGNOSIS — R339 Retention of urine, unspecified: Secondary | ICD-10-CM | POA: Diagnosis not present

## 2018-04-30 DIAGNOSIS — N35814 Other anterior urethral stricture, male: Secondary | ICD-10-CM | POA: Diagnosis not present

## 2018-04-30 DIAGNOSIS — N35914 Unspecified anterior urethral stricture, male: Secondary | ICD-10-CM | POA: Diagnosis not present

## 2018-04-30 DIAGNOSIS — M25561 Pain in right knee: Secondary | ICD-10-CM | POA: Diagnosis not present

## 2018-05-22 DIAGNOSIS — K219 Gastro-esophageal reflux disease without esophagitis: Secondary | ICD-10-CM | POA: Diagnosis not present

## 2018-05-22 DIAGNOSIS — Z86010 Personal history of colonic polyps: Secondary | ICD-10-CM | POA: Diagnosis not present

## 2018-05-28 DIAGNOSIS — N35919 Unspecified urethral stricture, male, unspecified site: Secondary | ICD-10-CM | POA: Diagnosis not present

## 2018-05-28 DIAGNOSIS — N35814 Other anterior urethral stricture, male: Secondary | ICD-10-CM | POA: Diagnosis not present

## 2018-05-28 DIAGNOSIS — N35819 Other urethral stricture, male, unspecified site: Secondary | ICD-10-CM | POA: Diagnosis not present

## 2018-05-28 DIAGNOSIS — N3289 Other specified disorders of bladder: Secondary | ICD-10-CM | POA: Diagnosis not present

## 2018-05-28 DIAGNOSIS — R339 Retention of urine, unspecified: Secondary | ICD-10-CM | POA: Diagnosis not present

## 2018-05-28 DIAGNOSIS — N368 Other specified disorders of urethra: Secondary | ICD-10-CM | POA: Diagnosis not present

## 2018-07-17 DIAGNOSIS — N35814 Other anterior urethral stricture, male: Secondary | ICD-10-CM | POA: Diagnosis not present

## 2018-07-17 DIAGNOSIS — N368 Other specified disorders of urethra: Secondary | ICD-10-CM | POA: Diagnosis not present

## 2018-07-17 DIAGNOSIS — N35914 Unspecified anterior urethral stricture, male: Secondary | ICD-10-CM | POA: Diagnosis not present

## 2018-07-17 DIAGNOSIS — K219 Gastro-esophageal reflux disease without esophagitis: Secondary | ICD-10-CM | POA: Diagnosis not present

## 2018-07-17 DIAGNOSIS — I10 Essential (primary) hypertension: Secondary | ICD-10-CM | POA: Diagnosis not present

## 2018-07-18 IMAGING — MR MR CERVICAL SPINE W/O CM
4 of 5 series · 29 of 48 positions shown · non-contrast
Comparison: None.

CLINICAL DATA: Hyper reflexia and unsteady gait.

EXAM:
MRI CERVICAL SPINE WITHOUT CONTRAST
TECHNIQUE: Multiplanar, multisequence MR imaging of the cervical spine was
performed. No intravenous contrast was administered.

[Series 5: T2 · sagittal · 3.0mm · 0.55mm/px · 8 of 17 slices shown (1 of 2)]
[im 1/17]
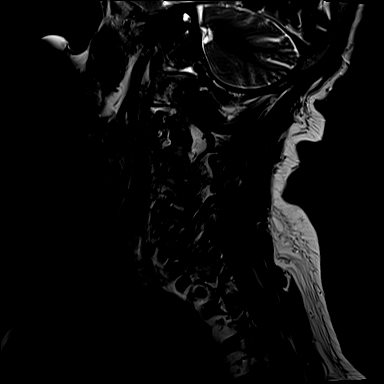
[im 3/17]
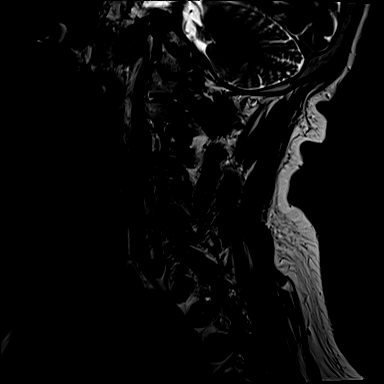
[im 5/17]
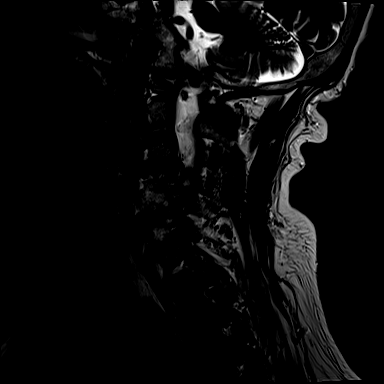
[im 7/17]
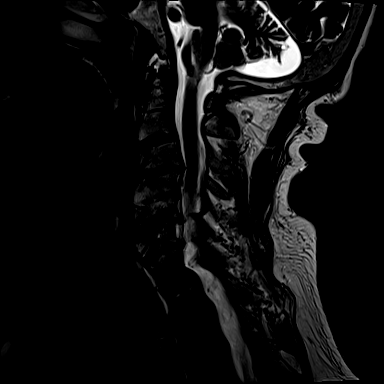
[im 10/17]
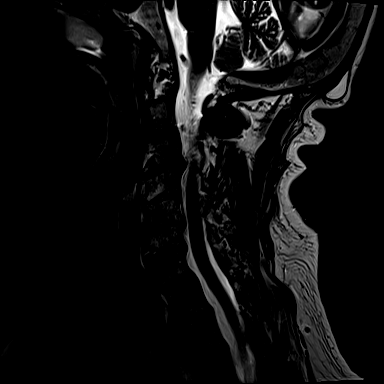
[im 12/17]
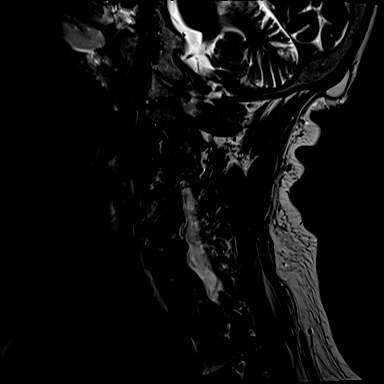
[im 14/17]
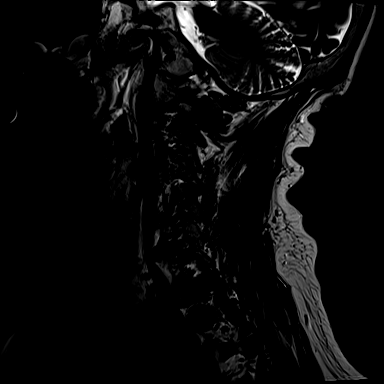
[im 17/17]
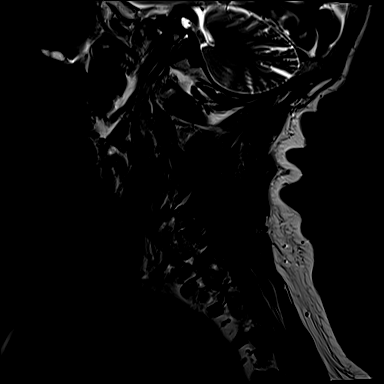

[Series 6: STIR · sagittal · 3.0mm · 0.33mm/px · 5 of 17 slices shown]
[im 1/17]
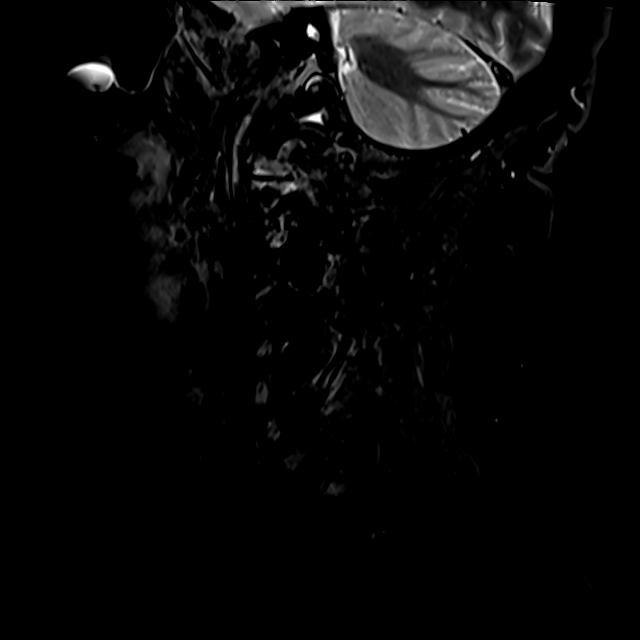
[im 3/17]
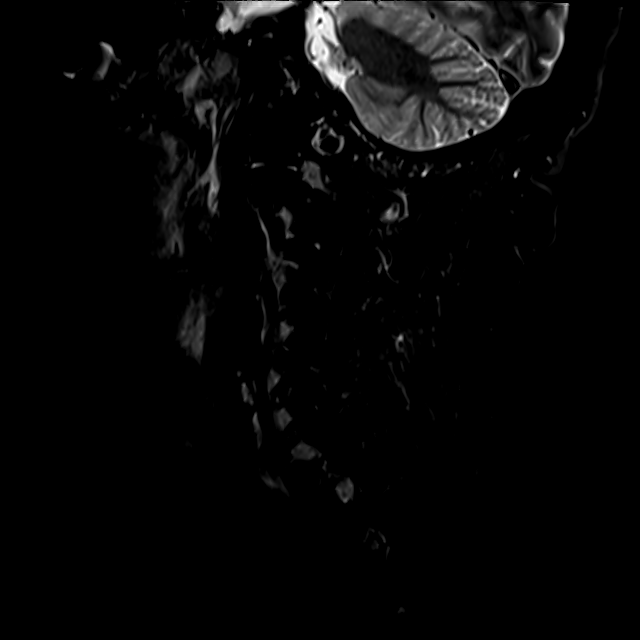
[im 6/17]
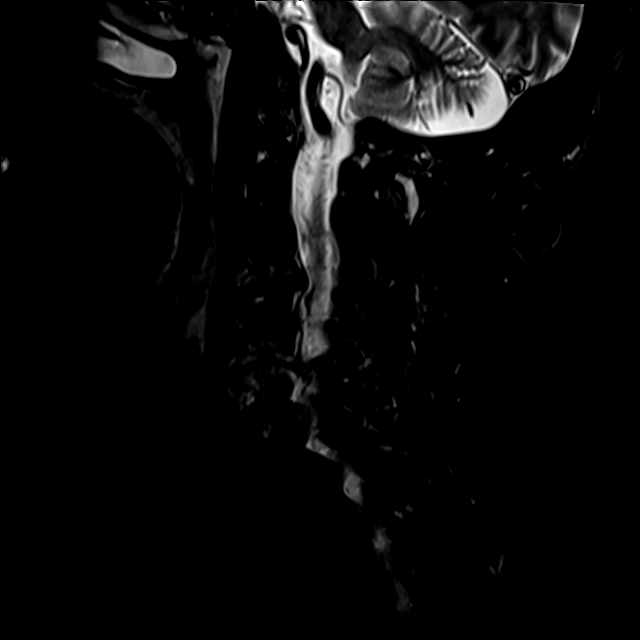
[im 9/17]
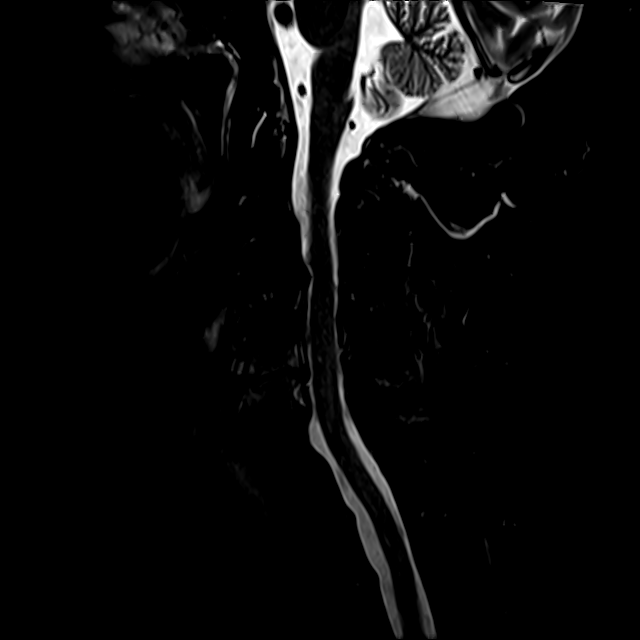
[im 14/17]
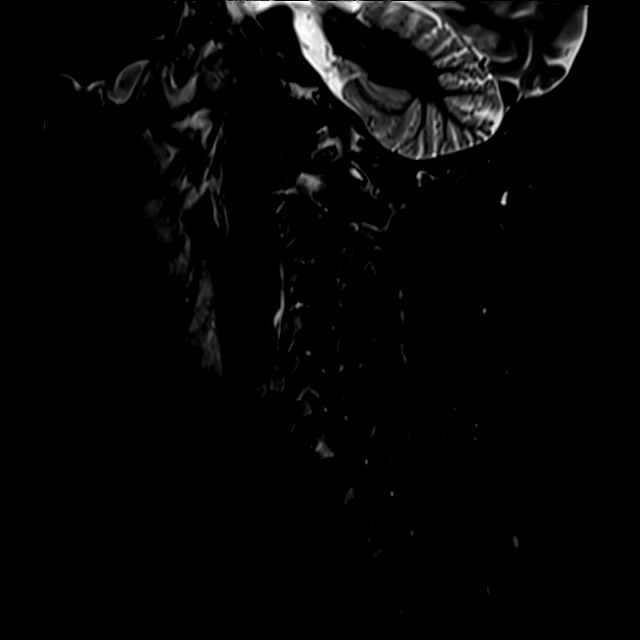

[Series 7: T1 · sagittal · 3.0mm · 0.66mm/px · 7 of 17 slices shown]
[im 1/17]
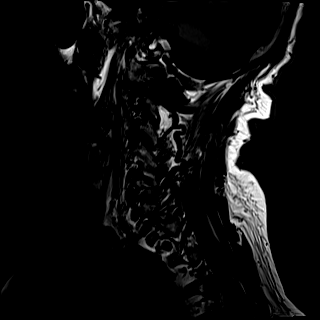
[im 3/17]
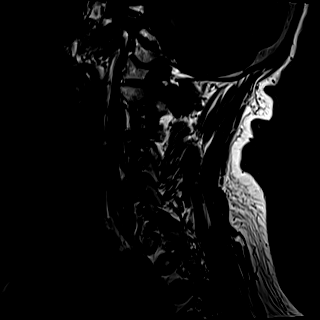
[im 6/17]
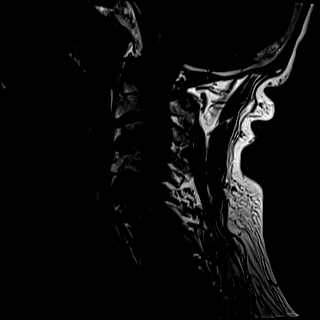
[im 9/17]
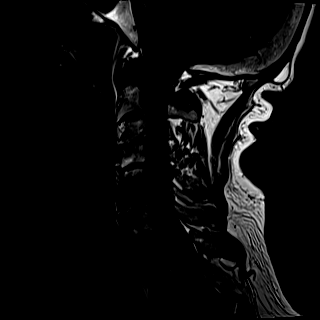
[im 11/17]
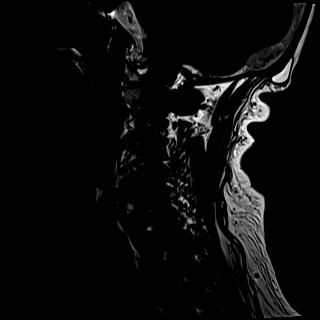
[im 14/17]
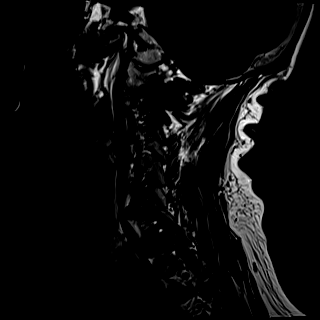
[im 17/17]
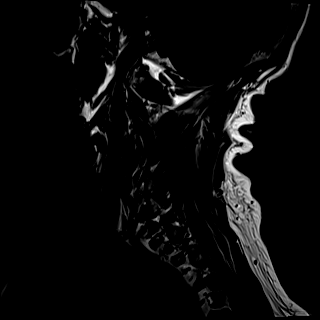

[Series 8: T2 · axial · 3.0mm · 0.50mm/px · z∈[-31,+59]mm · 9 of 30 slices shown (2 of 2)]
[im 1/30]
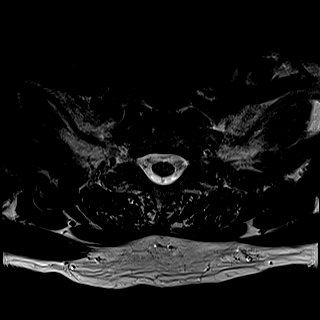
[im 5/30]
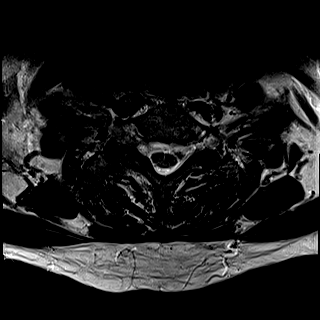
[im 10/30]
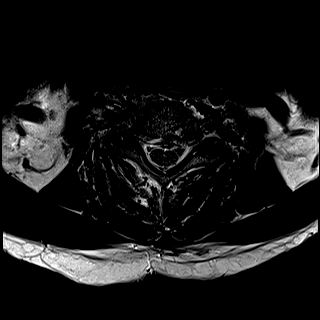
[im 13/30]
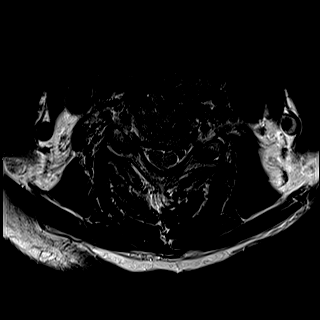
[im 15/30]
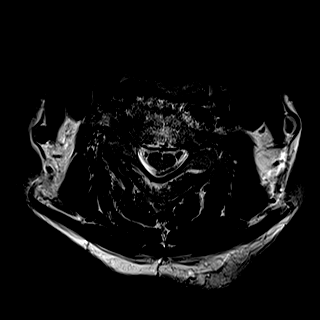
[im 17/30]
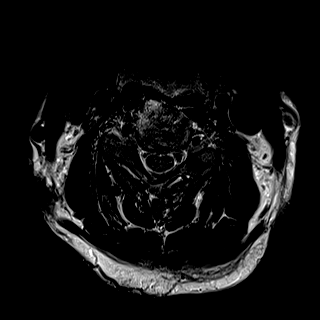
[im 20/30]
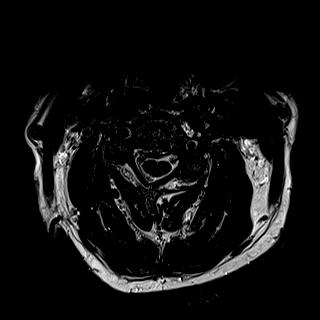
[im 25/30]
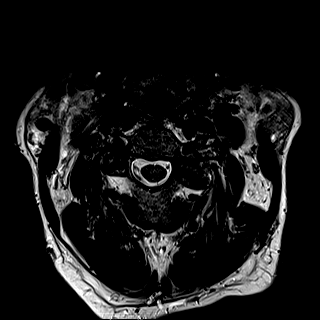
[im 30/30]
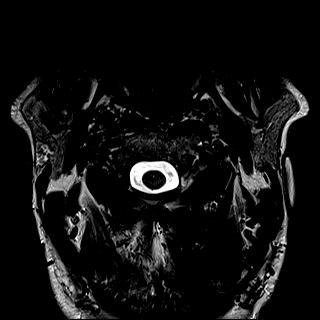

[29 of 48 positions shown; findings below may reference images not displayed]

FINDINGS: Alignment: 2-3 mm of facet mediated anterolisthesis at C2-3 and
C7-T1.

Vertebrae: Edematous signal about the C5-6 disc space that is
degenerative appearing. No acute fracture, discitis, or aggressive
bone lesion.

Cord: Normal signal and morphology in the cervical spine. In the
level of the upper thoracic spine the cord is somewhat posteriorly
positioned in the canal but no collection or signal abnormality was
seen on previous thoracic MRI.

Posterior Fossa, vertebral arteries, paraspinal tissues: Negative

Disc levels:

C2-3: Facet arthropathy that is particularly advanced on the left.
Mild disc narrowing. Patent canal and foramina

C3-4: Degenerative disc narrowing with endplate and uncovertebral
ridging. Ventral subarachnoid space effacement without compression.
Facet spurring greater on the left. Moderate biforaminal narrowing.

C4-5: Ankylosis or fusion.  Patent canal and foramina

C5-6: Advanced degenerative disc narrowing with endplate and
uncovertebral ridging greater towards the right. Right foraminal
impingement. Mild left foraminal narrowing. Patent canal.

C6-7: Advanced degenerative disc and narrowing with endplate and
uncovertebral ridging. Right foraminal impingement.

C7-T1:Facet arthropathy with anterolisthesis. Patent canal and
foramina.
IMPRESSION: 1. Negative cervical cord.  No compression or signal abnormality.
2. Advanced disc and facet degeneration as described. There is
foraminal impingement bilaterally at C3-4 and on the right at C5-6
and C6-7.

## 2018-07-23 DIAGNOSIS — E7849 Other hyperlipidemia: Secondary | ICD-10-CM | POA: Diagnosis not present

## 2018-07-23 DIAGNOSIS — R7309 Other abnormal glucose: Secondary | ICD-10-CM | POA: Diagnosis not present

## 2018-07-23 DIAGNOSIS — I10 Essential (primary) hypertension: Secondary | ICD-10-CM | POA: Diagnosis not present

## 2018-07-23 DIAGNOSIS — R82998 Other abnormal findings in urine: Secondary | ICD-10-CM | POA: Diagnosis not present

## 2018-07-23 DIAGNOSIS — Z125 Encounter for screening for malignant neoplasm of prostate: Secondary | ICD-10-CM | POA: Diagnosis not present

## 2018-07-23 DIAGNOSIS — Z0000 Encounter for general adult medical examination without abnormal findings: Secondary | ICD-10-CM | POA: Diagnosis not present

## 2018-07-30 DIAGNOSIS — R7309 Other abnormal glucose: Secondary | ICD-10-CM | POA: Diagnosis not present

## 2018-07-30 DIAGNOSIS — I454 Nonspecific intraventricular block: Secondary | ICD-10-CM | POA: Diagnosis not present

## 2018-07-30 DIAGNOSIS — Z0000 Encounter for general adult medical examination without abnormal findings: Secondary | ICD-10-CM | POA: Diagnosis not present

## 2018-07-30 DIAGNOSIS — F1320 Sedative, hypnotic or anxiolytic dependence, uncomplicated: Secondary | ICD-10-CM | POA: Diagnosis not present

## 2018-07-30 DIAGNOSIS — N401 Enlarged prostate with lower urinary tract symptoms: Secondary | ICD-10-CM | POA: Diagnosis not present

## 2018-07-30 DIAGNOSIS — E663 Overweight: Secondary | ICD-10-CM | POA: Diagnosis not present

## 2018-07-30 DIAGNOSIS — R338 Other retention of urine: Secondary | ICD-10-CM | POA: Diagnosis not present

## 2018-07-30 DIAGNOSIS — Z960 Presence of urogenital implants: Secondary | ICD-10-CM | POA: Diagnosis not present

## 2018-07-30 DIAGNOSIS — F418 Other specified anxiety disorders: Secondary | ICD-10-CM | POA: Diagnosis not present

## 2018-08-06 DIAGNOSIS — Z87448 Personal history of other diseases of urinary system: Secondary | ICD-10-CM | POA: Diagnosis not present

## 2018-08-06 DIAGNOSIS — N2889 Other specified disorders of kidney and ureter: Secondary | ICD-10-CM | POA: Diagnosis not present

## 2018-08-06 DIAGNOSIS — N368 Other specified disorders of urethra: Secondary | ICD-10-CM | POA: Diagnosis not present

## 2018-09-15 DIAGNOSIS — Z23 Encounter for immunization: Secondary | ICD-10-CM | POA: Diagnosis not present

## 2018-09-26 NOTE — Progress Notes (Signed)
NEUROLOGY FOLLOW UP OFFICE NOTE  Douglas Taylor [id number]  HISTORY OF PRESENT ILLNESS: Douglas Taylor is a 72 year old right-handed male who follows up for bilateral leg pain.  UPDATE:  He currently takes gabapentin 100 mg at bedtime (300 mg at bedtime cause drowsiness), and B12 1000 mcg daily.  Pain controlled on gabapentin.  Able to stand longer.  She was able to stand up to 4 hours at work without difficulty.  Started walking for exercise and noted he was unsteady.  He started to [employer] which keeps him walking straight.  When he gets up in the morning, he feels like he is walking on stilts" for a couple of minutes but then it levels out.  He has had left knee surgery which causes some discomfort.  HISTORY:  In June 2018, he had a left knee scope without complication.  Beginning in September 2018, he has had a progressive pain in both legs.  He reports a deep burning pain.  Initially, it radiated down to the dorsum of both feet but now it only involves the shin, usually from the knee down but occasionally just above the knees.  There is a fairly constant pain that is often dull but he has fluctuations of severity throughout the day.  Sometimes he reports severe knee pain on the right side, sometimes the left side and sometimes both at the same time.  He denies weakness.  He has mild "tinge" in the mid low back but otherwise no significant back pain.  He denies neck pain or pain, numbness, or weakness in the upper extremities.  Due to the pain, his gait is often affected, causing him to hobble.  He reports no change in bowel or bladder function.  He denies any back or neck injury.  He had an MRI of the lumbar spine on 09/28/17, which demonstrated mild lumbar spondylosis with no evidence of neural foraminal or central canal stenosis.  Venous ultrasound for DVT was negative.  B12 was 256, so he was advised to start 1086mcg daily. Due to hyperreflexia on exam, he had MRI of  thoracic spine with and without contrast on 01/22/18 revaled multilevel spondylosis with central disc protrusion at T10-11 and bilateral foraminal narrowing but no cord compression.  An MRI of the cervical spine from 02/09/18 revealed advanced degenerative disc and facet disease but also without cord compression.    PAST MEDICAL HISTORY: Past Medical History:  Diagnosis Date  . Anxiety   . GERD (gastroesophageal reflux disease)   . History of gastric ulcer    2004 APPROX.  . Seasonal allergies   . Urethral stricture    W/ RETENTION  . Wears glasses     MEDICATIONS: Current Outpatient Medications on File Prior to Visit  Medication Sig Dispense Refill  . acetaminophen (TYLENOL) 325 MG tablet Take 650 mg by mouth every 6 (six) hours as needed for moderate pain.    Marland Kitchen ALPRAZolam (XANAX) 0.5 MG tablet Take 0.75 mg by mouth daily with breakfast.     . gabapentin (NEURONTIN) 100 MG capsule Take 1 capsule (100 mg total) by mouth at bedtime. 30 capsule 5  . HYDROcodone-acetaminophen (NORCO) 5-325 MG per tablet Take 1-2 tablets by mouth every 6 (six) hours as needed for moderate pain or severe pain. Post-operatively (Patient not taking: Reported on 07/03/2015) 30 tablet 0  . senna-docusate (SENOKOT-S) 8.6-50 MG per tablet Take 1 tablet by mouth 2 (two) times daily. While taking pain meds to prevent constipation (Patient  not taking: Reported on 07/03/2015) 30 tablet 0  . sulfamethoxazole-trimethoprim (BACTRIM DS,SEPTRA DS) 800-160 MG per tablet Take 1 tablet by mouth 2 (two) times daily. X 5 days to prevent post-op infection (Patient not taking: Reported on 07/03/2015) 10 tablet 0  . traMADol (ULTRAM) 50 MG tablet Take 50 mg by mouth every 6 (six) hours as needed.     No current facility-administered medications on file prior to visit.     ALLERGIES: No Known Allergies  FAMILY HISTORY: Family History  Problem Relation Age of Onset  . Heart disease Mother   . Multiple sclerosis Sister    SOCIAL  HISTORY: Social History   Socioeconomic History  . Marital status: Married    Spouse name: Caren Griffins   . Number of children: 1  . Years of education: Not on file  . Highest education level: 12th grade  Occupational History  . Occupation: retired  Scientific laboratory technician  . Financial resource strain: Not on file  . Food insecurity:    Worry: Not on file    Inability: Not on file  . Transportation needs:    Medical: Not on file    Non-medical: Not on file  Tobacco Use  . Smoking status: Never Smoker  . Smokeless tobacco: Never Used  Substance and Sexual Activity  . Alcohol use: No  . Drug use: No  . Sexual activity: Not on file  Lifestyle  . Physical activity:    Days per week: Not on file    Minutes per session: Not on file  . Stress: Not on file  Relationships  . Social connections:    Talks on phone: Not on file    Gets together: Not on file    Attends religious service: Not on file    Active member of club or organization: Not on file    Attends meetings of clubs or organizations: Not on file    Relationship status: Not on file  . Intimate partner violence:    Fear of current or ex partner: Not on file    Emotionally abused: Not on file    Physically abused: Not on file    Forced sexual activity: Not on file  Other Topics Concern  . Not on file  Social History Narrative   Married, livves with wife in a 1 story house. Patient is right-handed. Drinks 2 cans of soda a day and an occasional glass of tea with meals. 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    REVIEW OF SYSTEMS: Constitutional: No fevers, chills, or sweats, no generalized fatigue, change in appetite Eyes: No visual changes, double vision, eye pain Ear, nose and throat: No hearing loss, ear pain, nasal congestion, sore throat Cardiovascular: No chest pain, palpitations Respiratory:  No shortness of breath at rest or with exertion,  wheezes GastrointestinaI: No nausea, vomiting, diarrhea, abdominal pain, fecal incontinence Genitourinary:  No dysuria, urinary retention or frequency Musculoskeletal:  No neck pain, back pain Integumentary: No rash, pruritus, skin lesions Neurological: as above Psychiatric: No depression, insomnia, anxiety Endocrine: No palpitations, fatigue, diaphoresis, mood swings, change in appetite, change in weight, increased thirst Hematologic/Lymphatic:  No purpura, petechiae. Allergic/Immunologic: no itchy/runny eyes, nasal congestion, recent allergic reactions, rashes  PHYSICAL EXAM: Blood pressure 100/80, pulse 77, height 5\' 7"  (1.702 m), weight 181 lb (82.1 kg), SpO2 98 %. General: No acute distress.  Patient appears well-groomed.   Head:  Normocephalic/atraumatic Eyes:  Fundi examined but  not visualized Neck: supple, no paraspinal tenderness, full range of motion Heart:  Regular rate and rhythm Lungs:  Clear to auscultation bilaterally Back: No paraspinal tenderness Neurological Exam: alert and oriented to person, place, and time. Attention span and concentration intact, recent and remote memory intact, fund of knowledge intact.  Speech fluent and not dysarthric, language intact.  CN II-XII intact. Bulk and tone normal, muscle strength 5/5 throughout.  Sensation to light touch, temperature intact.  Slight reduced pinprick sensation in feet.  Deep tendon reflexes 3+ in patellars, otherwise 2+ throughout, toes downgoing.  Finger to nose and heel to shin testing intact.  Gait normal, Romberg negative.  IMPRESSION: Bilateral leg pain, improved  PLAN: 1.  Gabapentin 100 mg at bedtime.  He may try discontinuing it to see if pain remains controlled. 2.  If symptoms worsen, consider NCV-EMG in future 3.  Follow up in 6 months.  19 minutes spent with the patient, over 50% spent discussing management.  Metta Clines, DO  CC: Shon Baton, MD

## 2018-09-27 ENCOUNTER — Ambulatory Visit: Payer: Medicare HMO | Admitting: Neurology

## 2018-09-27 ENCOUNTER — Encounter: Payer: Self-pay | Admitting: Neurology

## 2018-09-27 VITALS — BP 100/80 | HR 77 | Ht 67.0 in | Wt 181.0 lb

## 2018-09-27 DIAGNOSIS — M79604 Pain in right leg: Secondary | ICD-10-CM

## 2018-09-27 DIAGNOSIS — M79605 Pain in left leg: Secondary | ICD-10-CM

## 2018-09-27 NOTE — Patient Instructions (Signed)
1.  Continue gabapentin 100mg  at bedtime.  You can try stopping it and just restart it if pain returns 2.  Follow up in 6 months.

## 2018-10-09 DIAGNOSIS — R339 Retention of urine, unspecified: Secondary | ICD-10-CM | POA: Diagnosis not present

## 2018-10-11 DIAGNOSIS — H04123 Dry eye syndrome of bilateral lacrimal glands: Secondary | ICD-10-CM | POA: Diagnosis not present

## 2018-10-11 DIAGNOSIS — H40013 Open angle with borderline findings, low risk, bilateral: Secondary | ICD-10-CM | POA: Diagnosis not present

## 2018-10-11 DIAGNOSIS — H26491 Other secondary cataract, right eye: Secondary | ICD-10-CM | POA: Diagnosis not present

## 2018-10-11 DIAGNOSIS — H25812 Combined forms of age-related cataract, left eye: Secondary | ICD-10-CM | POA: Diagnosis not present

## 2018-10-11 DIAGNOSIS — H31091 Other chorioretinal scars, right eye: Secondary | ICD-10-CM | POA: Diagnosis not present

## 2018-10-11 DIAGNOSIS — Z961 Presence of intraocular lens: Secondary | ICD-10-CM | POA: Diagnosis not present

## 2018-10-24 ENCOUNTER — Other Ambulatory Visit: Payer: Self-pay | Admitting: Neurology

## 2019-03-26 ENCOUNTER — Telehealth: Payer: Self-pay | Admitting: Neurology

## 2019-03-26 ENCOUNTER — Other Ambulatory Visit: Payer: Self-pay | Admitting: *Deleted

## 2019-03-26 MED ORDER — GABAPENTIN 100 MG PO CAPS: 200.0000 mg | ORAL_CAPSULE | Freq: Every day | ORAL | 5 refills | Status: DC

## 2019-03-26 NOTE — Telephone Encounter (Signed)
Rx has already been sent in 

## 2019-03-26 NOTE — Telephone Encounter (Signed)
Last note says one a day.  Ok to increase to 2 a day?

## 2019-03-26 NOTE — Telephone Encounter (Signed)
Rx sent in

## 2019-03-26 NOTE — Telephone Encounter (Signed)
Yes.  2 a day is fine.

## 2019-03-26 NOTE — Telephone Encounter (Signed)
Patient is calling in about gabapentin medication refill for [facility] [address]. Please send. Thanks!

## 2019-03-26 NOTE — Telephone Encounter (Signed)
Patient Douglas Taylor needing a refill on his Gabapentin. He was originally taking 1 but has increased it to 2 pills a day. He is needing a refill  On his Gabapentin. Thanks

## 2019-03-29 ENCOUNTER — Ambulatory Visit: Payer: Medicare HMO | Admitting: Neurology

## 2019-07-25 DIAGNOSIS — R739 Hyperglycemia, unspecified: Secondary | ICD-10-CM | POA: Diagnosis not present

## 2019-07-25 DIAGNOSIS — Z125 Encounter for screening for malignant neoplasm of prostate: Secondary | ICD-10-CM | POA: Diagnosis not present

## 2019-07-25 DIAGNOSIS — R82998 Other abnormal findings in urine: Secondary | ICD-10-CM | POA: Diagnosis not present

## 2019-07-25 DIAGNOSIS — E7849 Other hyperlipidemia: Secondary | ICD-10-CM | POA: Diagnosis not present

## 2019-08-01 DIAGNOSIS — N401 Enlarged prostate with lower urinary tract symptoms: Secondary | ICD-10-CM | POA: Diagnosis not present

## 2019-08-01 DIAGNOSIS — R600 Localized edema: Secondary | ICD-10-CM | POA: Diagnosis not present

## 2019-08-01 DIAGNOSIS — F419 Anxiety disorder, unspecified: Secondary | ICD-10-CM | POA: Diagnosis not present

## 2019-08-01 DIAGNOSIS — F1320 Sedative, hypnotic or anxiolytic dependence, uncomplicated: Secondary | ICD-10-CM | POA: Diagnosis not present

## 2019-08-01 DIAGNOSIS — R339 Retention of urine, unspecified: Secondary | ICD-10-CM | POA: Diagnosis not present

## 2019-08-01 DIAGNOSIS — E663 Overweight: Secondary | ICD-10-CM | POA: Diagnosis not present

## 2019-08-01 DIAGNOSIS — Z0000 Encounter for general adult medical examination without abnormal findings: Secondary | ICD-10-CM | POA: Diagnosis not present

## 2019-08-01 DIAGNOSIS — R739 Hyperglycemia, unspecified: Secondary | ICD-10-CM | POA: Diagnosis not present

## 2019-08-01 DIAGNOSIS — I454 Nonspecific intraventricular block: Secondary | ICD-10-CM | POA: Diagnosis not present

## 2019-08-21 DIAGNOSIS — Z23 Encounter for immunization: Secondary | ICD-10-CM | POA: Diagnosis not present

## 2019-09-18 ENCOUNTER — Other Ambulatory Visit: Payer: Self-pay | Admitting: Neurology

## 2019-09-20 NOTE — Telephone Encounter (Signed)
Requested Prescriptions   Pending Prescriptions Disp Refills  . gabapentin (NEURONTIN) 100 MG capsule [Pharmacy Med Name: GABAPENTIN 100 MG CAPSULE] 60 capsule 4    Sig: TAKE TWO CAPSULES BY MOUTH AT BEDTIME   Rx last filled: 03/26/19 #60 5 refills  Pt last seen:09/27/18  Follow up appt scheduled:none

## 2019-09-26 NOTE — Progress Notes (Signed)
NEUROLOGY FOLLOW UP OFFICE NOTE  GARLEN NAFFZIGER [id number]  HISTORY OF PRESENT ILLNESS: Douglas Taylor is a 73 year old right-handed male who follows up for bilateral leg pain.  UPDATE:  He currently takes gabapentin 200 mg at bedtime (300 mg at bedtime cause drowsiness), and B12 1000 mcg daily.  Since last year, he has had to increase gabapentin to 100mg  twice daily.  Overall pain is improved.  Legs are sore first thing in morning and later in the afternoon.  He takes the gabapentin at 7 AM and in late afternoon (6 PM).  Pain is more tolerable when he is up and moving about.    HISTORY:  In June 2018, he had a left knee scope without complication.  Beginning in September 2018, he has had a progressive pain in both legs.  He reports a deep burning pain.  Initially, it radiated down to the dorsum of both feet but now it only involves the shin, usually from the knee down but occasionally just above the knees.  There is a fairly constant pain that is often dull but he has fluctuations of severity throughout the day.  Sometimes he reports severe knee pain on the right side, sometimes the left side and sometimes both at the same time.  He denies weakness.  He has mild "tinge" in the mid low back but otherwise no significant back pain.  He denies neck pain or pain, numbness, or weakness in the upper extremities.  Due to the pain, his gait is often affected, causing him to hobble.  He reports no change in bowel or bladder function.  He denies any back or neck injury.  He had an MRI of the lumbar spine on 09/28/17, which demonstrated mild lumbar spondylosis with no evidence of neural foraminal or central canal stenosis. Venous ultrasound for DVT was negative. B12 was 256, so he was advised to start 1052mcg daily. Due to hyperreflexia on exam, he had MRI of thoracic spine with and without contrast on 01/22/18 revaled multilevel spondylosis with central disc protrusion at T10-11 and bilateral  foraminal narrowing but no cord compression. An MRI of the cervical spine from 02/09/18 revealed advanced degenerative disc and facet disease but also without cord compression.   He has had left knee surgery.   PAST MEDICAL HISTORY: Past Medical History:  Diagnosis Date  . Anxiety   . GERD (gastroesophageal reflux disease)   . History of gastric ulcer    2004 APPROX.  . Seasonal allergies   . Urethral stricture    W/ RETENTION  . Wears glasses     MEDICATIONS: Current Outpatient Medications on File Prior to Visit  Medication Sig Dispense Refill  . acetaminophen (TYLENOL) 325 MG tablet Take 650 mg by mouth every 6 (six) hours as needed for moderate pain.    Marland Kitchen ALPRAZolam (XANAX) 0.5 MG tablet Take 0.75 mg by mouth daily with breakfast.     . gabapentin (NEURONTIN) 100 MG capsule Take 2 capsules (200 mg total) by mouth at bedtime. 60 capsule 5  . HYDROcodone-acetaminophen (NORCO) 5-325 MG per tablet Take 1-2 tablets by mouth every 6 (six) hours as needed for moderate pain or severe pain. Post-operatively (Patient not taking: Reported on 07/03/2015) 30 tablet 0  . senna-docusate (SENOKOT-S) 8.6-50 MG per tablet Take 1 tablet by mouth 2 (two) times daily. While taking pain meds to prevent constipation (Patient not taking: Reported on 07/03/2015) 30 tablet 0  . sulfamethoxazole-trimethoprim (BACTRIM DS,SEPTRA DS) 800-160 MG per tablet Take  1 tablet by mouth 2 (two) times daily. X 5 days to prevent post-op infection (Patient not taking: Reported on 07/03/2015) 10 tablet 0  . traMADol (ULTRAM) 50 MG tablet Take 50 mg by mouth every 6 (six) hours as needed.     No current facility-administered medications on file prior to visit.     ALLERGIES: No Known Allergies  FAMILY HISTORY: Family History  Problem Relation Age of Onset  . Heart disease Mother   . Multiple sclerosis Sister     SOCIAL HISTORY: Social History   Socioeconomic History  . Marital status: Married    Spouse name:  Caren Griffins   . Number of children: 1  . Years of education: Not on file  . Highest education level: 12th grade  Occupational History  . Occupation: retired  Scientific laboratory technician  . Financial resource strain: Not on file  . Food insecurity    Worry: Not on file    Inability: Not on file  . Transportation needs    Medical: Not on file    Non-medical: Not on file  Tobacco Use  . Smoking status: Never Smoker  . Smokeless tobacco: Never Used  Substance and Sexual Activity  . Alcohol use: No  . Drug use: No  . Sexual activity: Not on file  Lifestyle  . Physical activity    Days per week: Not on file    Minutes per session: Not on file  . Stress: Not on file  Relationships  . Social Herbalist on phone: Not on file    Gets together: Not on file    Attends religious service: Not on file    Active member of club or organization: Not on file    Attends meetings of clubs or organizations: Not on file    Relationship status: Not on file  . Intimate partner violence    Fear of current or ex partner: Not on file    Emotionally abused: Not on file    Physically abused: Not on file    Forced sexual activity: Not on file  Other Topics Concern  . Not on file  Social History Narrative   Married, livves with wife in a 1 story house. Patient is right-handed. Drinks 2 cans of soda a day and an occasional glass of tea with meals. 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    REVIEW OF SYSTEMS: Constitutional: No fevers, chills, or sweats, no generalized fatigue, change in appetite Eyes: No visual changes, double vision, eye pain Ear, nose and throat: No hearing loss, ear pain, nasal congestion, sore throat Cardiovascular: No chest pain, palpitations Respiratory:  No shortness of breath at rest or with exertion, wheezes GastrointestinaI: No nausea, vomiting, diarrhea, abdominal pain, fecal incontinence Genitourinary:  No  dysuria, urinary retention or frequency Musculoskeletal:  No neck pain, back pain Integumentary: No rash, pruritus, skin lesions Neurological: as above Psychiatric: No depression, insomnia, anxiety Endocrine: No palpitations, fatigue, diaphoresis, mood swings, change in appetite, change in weight, increased thirst Hematologic/Lymphatic:  No purpura, petechiae. Allergic/Immunologic: no itchy/runny eyes, nasal congestion, recent allergic reactions, rashes  PHYSICAL EXAM: Blood pressure (!) 157/93, pulse 80, height 5\' 5"  (1.651 m), weight 186 lb 6.4 oz (84.6 kg), SpO2 99 %. General: No acute distress.  Patient appears well-groomed.   Head:  Normocephalic/atraumatic Eyes:  Fundi examined but not visualized Neck: supple, no paraspinal tenderness, full range of motion Heart:  Regular rate  and rhythm Lungs:  Clear to auscultation bilaterally Back: No paraspinal tenderness Neurological Exam: alert and oriented to person, place, and time. Attention span and concentration intact, recent and remote memory intact, fund of knowledge intact.  Speech fluent and not dysarthric, language intact.  CN II-XII intact. Bulk and tone normal, muscle strength 5/5 throughout.  Sensation to light touch, temperature and vibration intact.  Deep tendon reflexes 2+ throughout except 3+ in patellars, toes downgoing.  Finger to nose and heel to shin testing intact.  Gait normal, Romberg negative.  IMPRESSION: Bilateral leg pain, stable  PLAN: 1.  Refilled gabapentin 100mg  twice daily 2.  Follow up in one year  15 minutes spent face to face with patient, over 50% spent discussing management.   Metta Clines, DO  CC: Shon Baton, MD

## 2019-09-30 ENCOUNTER — Ambulatory Visit (INDEPENDENT_AMBULATORY_CARE_PROVIDER_SITE_OTHER): Payer: Medicare HMO | Admitting: Neurology

## 2019-09-30 ENCOUNTER — Other Ambulatory Visit: Payer: Self-pay

## 2019-09-30 ENCOUNTER — Encounter: Payer: Self-pay | Admitting: Neurology

## 2019-09-30 VITALS — BP 157/93 | HR 80 | Ht 65.0 in | Wt 186.4 lb

## 2019-09-30 DIAGNOSIS — M79605 Pain in left leg: Secondary | ICD-10-CM

## 2019-09-30 DIAGNOSIS — M79604 Pain in right leg: Secondary | ICD-10-CM

## 2019-09-30 MED ORDER — GABAPENTIN 100 MG PO CAPS: 100.0000 mg | ORAL_CAPSULE | Freq: Two times a day (BID) | ORAL | 3 refills | Status: DC

## 2019-10-10 DIAGNOSIS — H31091 Other chorioretinal scars, right eye: Secondary | ICD-10-CM | POA: Diagnosis not present

## 2019-10-10 DIAGNOSIS — Z961 Presence of intraocular lens: Secondary | ICD-10-CM | POA: Diagnosis not present

## 2019-10-10 DIAGNOSIS — H40013 Open angle with borderline findings, low risk, bilateral: Secondary | ICD-10-CM | POA: Diagnosis not present

## 2019-10-10 DIAGNOSIS — H26491 Other secondary cataract, right eye: Secondary | ICD-10-CM | POA: Diagnosis not present

## 2019-10-10 DIAGNOSIS — H04123 Dry eye syndrome of bilateral lacrimal glands: Secondary | ICD-10-CM | POA: Diagnosis not present

## 2019-10-10 DIAGNOSIS — H25812 Combined forms of age-related cataract, left eye: Secondary | ICD-10-CM | POA: Diagnosis not present

## 2020-01-08 DIAGNOSIS — H40013 Open angle with borderline findings, low risk, bilateral: Secondary | ICD-10-CM | POA: Diagnosis not present

## 2020-01-08 DIAGNOSIS — H26491 Other secondary cataract, right eye: Secondary | ICD-10-CM | POA: Diagnosis not present

## 2020-02-10 DIAGNOSIS — H40013 Open angle with borderline findings, low risk, bilateral: Secondary | ICD-10-CM | POA: Diagnosis not present

## 2020-04-20 DIAGNOSIS — M25562 Pain in left knee: Secondary | ICD-10-CM | POA: Diagnosis not present

## 2020-06-01 DIAGNOSIS — M1712 Unilateral primary osteoarthritis, left knee: Secondary | ICD-10-CM | POA: Diagnosis not present

## 2020-07-30 DIAGNOSIS — E785 Hyperlipidemia, unspecified: Secondary | ICD-10-CM | POA: Diagnosis not present

## 2020-07-30 DIAGNOSIS — Z125 Encounter for screening for malignant neoplasm of prostate: Secondary | ICD-10-CM | POA: Diagnosis not present

## 2020-07-30 DIAGNOSIS — R739 Hyperglycemia, unspecified: Secondary | ICD-10-CM | POA: Diagnosis not present

## 2020-08-06 DIAGNOSIS — F419 Anxiety disorder, unspecified: Secondary | ICD-10-CM | POA: Diagnosis not present

## 2020-08-06 DIAGNOSIS — I454 Nonspecific intraventricular block: Secondary | ICD-10-CM | POA: Diagnosis not present

## 2020-08-06 DIAGNOSIS — I10 Essential (primary) hypertension: Secondary | ICD-10-CM | POA: Diagnosis not present

## 2020-08-06 DIAGNOSIS — N401 Enlarged prostate with lower urinary tract symptoms: Secondary | ICD-10-CM | POA: Diagnosis not present

## 2020-08-06 DIAGNOSIS — Z1212 Encounter for screening for malignant neoplasm of rectum: Secondary | ICD-10-CM | POA: Diagnosis not present

## 2020-08-06 DIAGNOSIS — F1320 Sedative, hypnotic or anxiolytic dependence, uncomplicated: Secondary | ICD-10-CM | POA: Diagnosis not present

## 2020-08-06 DIAGNOSIS — R600 Localized edema: Secondary | ICD-10-CM | POA: Diagnosis not present

## 2020-08-06 DIAGNOSIS — R82998 Other abnormal findings in urine: Secondary | ICD-10-CM | POA: Diagnosis not present

## 2020-08-06 DIAGNOSIS — Z0000 Encounter for general adult medical examination without abnormal findings: Secondary | ICD-10-CM | POA: Diagnosis not present

## 2020-08-06 DIAGNOSIS — R739 Hyperglycemia, unspecified: Secondary | ICD-10-CM | POA: Diagnosis not present

## 2020-08-06 DIAGNOSIS — E785 Hyperlipidemia, unspecified: Secondary | ICD-10-CM | POA: Diagnosis not present

## 2020-09-28 NOTE — Progress Notes (Deleted)
NEUROLOGY FOLLOW UP OFFICE NOTE  Douglas Taylor [id number]  HISTORY OF PRESENT ILLNESS: Douglas Taylor a 74 year old right-handed male who follows up for bilateral leg pain.  UPDATE: He currently takes gabapentin 200 mg at bedtime (300 mg at bedtime cause drowsiness), and B12 1000 mcg daily.  ***  HISTORY: In June 2018, he had a left knee scope without complication. Beginning in September 2018,he has had a progressive pain in both legs. He reports a deep burning pain. Initially, it radiated down to the dorsum of both feet but now it only involves the shin, usually from the knee down but occasionally just above the knees. There is a fairly constant pain that is often dull but he has fluctuations of severity throughout the day. Sometimes he reports severe knee pain on the right side, sometimes the left side and sometimes both at the same time. He denies weakness. He has mild "tinge" in the mid low back but otherwise no significant back pain. He denies neck pain or pain, numbness, or weakness in the upper extremities. Due to the pain, his gait is often affected, causing him to hobble. He reports no change in bowel or bladder function. He denies any back or neck injury.  He had an MRI of the lumbar spine on 09/28/17, which demonstrated mild lumbar spondylosis with no evidence of neural foraminal or central canal stenosis. Venous ultrasound for DVT was negative. B12 was 256, so he was advised to start 1019mcg daily. Due to hyperreflexia on exam, he had MRI of thoracic spine with and without contrast on 01/22/18 revaled multilevel spondylosis with central disc protrusion at T10-11 and bilateral foraminal narrowing but no cord compression. An MRI of the cervical spine from 02/09/18 revealed advanced degenerative disc and facet disease but also without cord compression.  He has had left knee surgery.  PAST MEDICAL HISTORY: Past Medical History:  Diagnosis Date  .  Anxiety   . GERD (gastroesophageal reflux disease)   . History of gastric ulcer    2004 APPROX.  . Seasonal allergies   . Urethral stricture    W/ RETENTION  . Wears glasses     MEDICATIONS: Current Outpatient Medications on File Prior to Visit  Medication Sig Dispense Refill  . acetaminophen (TYLENOL) 325 MG tablet Take 650 mg by mouth every 6 (six) hours as needed for moderate pain.    Marland Kitchen ALPRAZolam (XANAX) 0.5 MG tablet Take 0.75 mg by mouth daily with breakfast.     . gabapentin (NEURONTIN) 100 MG capsule Take 1 capsule (100 mg total) by mouth 2 (two) times daily. 180 capsule 3   No current facility-administered medications on file prior to visit.    ALLERGIES: No Known Allergies  FAMILY HISTORY: Family History  Problem Relation Age of Onset  . Heart disease Mother   . Multiple sclerosis Sister   . Healthy Daughter     SOCIAL HISTORY: Social History   Socioeconomic History  . Marital status: Married    Spouse name: Caren Griffins   . Number of children: 1  . Years of education: Not on file  . Highest education level: High school graduate  Occupational History  . Occupation: retired  Tobacco Use  . Smoking status: Never Smoker  . Smokeless tobacco: Never Used  Vaping Use  . Vaping Use: Never used  Substance and Sexual Activity  . Alcohol use: No  . Drug use: No  . Sexual activity: Not on file  Other Topics Concern  . Not on  file  Social History Narrative   Married, livves with wife in a 1 story house. Patient is right-handed. Drinks 2 cans of soda a day and an occasional glass of tea with meals. 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   Social Determinants of Health   Financial Resource Strain:   . Difficulty of Paying Living Expenses: Not on file  Food Insecurity:   . Worried About Charity fundraiser in the Last Year: Not on file  . Ran Out of Food in the Last Year: Not on file    Transportation Needs:   . Lack of Transportation (Medical): Not on file  . Lack of Transportation (Non-Medical): Not on file  Physical Activity:   . Days of Exercise per Week: Not on file  . Minutes of Exercise per Session: Not on file  Stress:   . Feeling of Stress : Not on file  Social Connections:   . Frequency of Communication with Friends and Family: Not on file  . Frequency of Social Gatherings with Friends and Family: Not on file  . Attends Religious Services: Not on file  . Active Member of Clubs or Organizations: Not on file  . Attends Archivist Meetings: Not on file  . Marital Status: Not on file  Intimate Partner Violence:   . Fear of Current or Ex-Partner: Not on file  . Emotionally Abused: Not on file  . Physically Abused: Not on file  . Sexually Abused: Not on file    PHYSICAL EXAM: *** General: No acute distress.  Patient appears well-groomed.   Head:  Normocephalic/atraumatic Eyes:  Fundi examined but not visualized Neck: supple, no paraspinal tenderness, full range of motion Heart:  Regular rate and rhythm Lungs:  Clear to auscultation bilaterally Back: No paraspinal tenderness Neurological Exam: alert and oriented to person, place, and time. Attention span and concentration intact, recent and remote memory intact, fund of knowledge intact.  Speech fluent and not dysarthric, language intact.  CN II-XII intact. Bulk and tone normal, muscle strength 5/5 throughout.  Sensation to light touch, temperature and vibration intact.  Deep tendon reflexes 2+ throughout, toes downgoing.  Finger to nose and heel to shin testing intact.  Gait normal, Romberg negative.  IMPRESSION: Bilateral leg pain, stable  PLAN: 1.  Gabapentin 100mg  twice daily 2.  Follow up one year  Metta Clines, DO  CC: Shon Baton, MD

## 2020-09-29 ENCOUNTER — Ambulatory Visit: Payer: Medicare HMO | Admitting: Neurology

## 2020-09-29 DIAGNOSIS — Z1211 Encounter for screening for malignant neoplasm of colon: Secondary | ICD-10-CM | POA: Diagnosis not present

## 2020-09-29 DIAGNOSIS — Z86010 Personal history of colonic polyps: Secondary | ICD-10-CM | POA: Diagnosis not present

## 2020-09-29 DIAGNOSIS — K219 Gastro-esophageal reflux disease without esophagitis: Secondary | ICD-10-CM | POA: Diagnosis not present

## 2020-09-29 DIAGNOSIS — K5900 Constipation, unspecified: Secondary | ICD-10-CM | POA: Diagnosis not present

## 2020-09-29 DIAGNOSIS — R1310 Dysphagia, unspecified: Secondary | ICD-10-CM | POA: Diagnosis not present

## 2020-10-12 ENCOUNTER — Other Ambulatory Visit: Payer: Self-pay | Admitting: Neurology

## 2020-10-12 DIAGNOSIS — R1310 Dysphagia, unspecified: Secondary | ICD-10-CM | POA: Diagnosis not present

## 2020-10-12 DIAGNOSIS — D124 Benign neoplasm of descending colon: Secondary | ICD-10-CM | POA: Diagnosis not present

## 2020-10-12 DIAGNOSIS — K219 Gastro-esophageal reflux disease without esophagitis: Secondary | ICD-10-CM | POA: Diagnosis not present

## 2020-10-12 DIAGNOSIS — D122 Benign neoplasm of ascending colon: Secondary | ICD-10-CM | POA: Diagnosis not present

## 2020-10-12 DIAGNOSIS — Z1211 Encounter for screening for malignant neoplasm of colon: Secondary | ICD-10-CM | POA: Diagnosis not present

## 2020-10-12 DIAGNOSIS — K635 Polyp of colon: Secondary | ICD-10-CM | POA: Diagnosis not present

## 2020-10-12 DIAGNOSIS — K317 Polyp of stomach and duodenum: Secondary | ICD-10-CM | POA: Diagnosis not present

## 2020-10-12 DIAGNOSIS — K319 Disease of stomach and duodenum, unspecified: Secondary | ICD-10-CM | POA: Diagnosis not present

## 2020-10-13 ENCOUNTER — Telehealth: Payer: Self-pay | Admitting: Neurology

## 2020-10-13 ENCOUNTER — Other Ambulatory Visit: Payer: Self-pay | Admitting: Neurology

## 2020-10-13 NOTE — Telephone Encounter (Signed)
Patient has been scheduled for a follow up on 04/07/21. He needs his gabapentin refilled to [facility] on [address]

## 2020-10-14 NOTE — Telephone Encounter (Signed)
Spoke with patient and gave him Dr Georgie Chard recommendations and he voiced voiced understanding.  Patient states he would like to be put on the cancellation list. Message sent to schedulers.   Patient states he missed his last appt because his wife broke his femur. He states if he would got gotten a reminder call he would have got a sitter to stay with his wife. He states he will contact [facility] office to see if they can refill his gabapentin.

## 2020-10-14 NOTE — Telephone Encounter (Signed)
He no-showed his last appointment and it is just too long since his last visit to continue prescribing a medication.  I would see if his PCP can refill it until his follow up.  Of course he can be placed on the waitlist so his visit can potentially be moved up.

## 2020-10-15 ENCOUNTER — Other Ambulatory Visit: Payer: Self-pay

## 2020-10-15 ENCOUNTER — Encounter: Payer: Self-pay | Admitting: Neurology

## 2020-10-15 ENCOUNTER — Ambulatory Visit: Payer: Medicare HMO | Admitting: Neurology

## 2020-10-15 VITALS — BP 143/83 | HR 66 | Resp 18 | Ht 66.0 in | Wt 171.0 lb

## 2020-10-15 DIAGNOSIS — M79604 Pain in right leg: Secondary | ICD-10-CM

## 2020-10-15 DIAGNOSIS — M79605 Pain in left leg: Secondary | ICD-10-CM

## 2020-10-15 MED ORDER — GABAPENTIN 100 MG PO CAPS
100.0000 mg | ORAL_CAPSULE | Freq: Two times a day (BID) | ORAL | 3 refills | Status: DC
Start: 2020-10-15 — End: 2021-09-07

## 2020-10-15 NOTE — Progress Notes (Signed)
NEUROLOGY FOLLOW UP OFFICE NOTE  BOLUWATIFE FLIGHT [id number]  HISTORY OF PRESENT ILLNESS: Douglas Taylor a 74 year old right-handed male who follows up for bilateral leg pain.  UPDATE: Current medication:  Gabapentin 100mg  twice daily Feeling well.  Lost weight. Pain controlled.  HISTORY: In June 2018, he had a left knee scope without complication. Beginning in September 2018,he has had a progressive pain in both legs. He reports a deep burning pain. Initially, it radiated down to the dorsum of both feet but now it only involves the shin, usually from the knee down but occasionally just above the knees. There is a fairly constant pain that is often dull but he has fluctuations of severity throughout the day. Sometimes he reports severe knee pain on the right side, sometimes the left side and sometimes both at the same time. He denies weakness. He has mild "tinge" in the mid low back but otherwise no significant back pain. He denies neck pain or pain, numbness, or weakness in the upper extremities. Due to the pain, his gait is often affected, causing him to hobble. He reports no change in bowel or bladder function. He denies any back or neck injury.  He had an MRI of the lumbar spine on 09/28/17, which demonstrated mild lumbar spondylosis with no evidence of neural foraminal or central canal stenosis. Venous ultrasound for DVT was negative. B12 was 256, so he was advised to start 1057mcg daily. Due to hyperreflexia on exam, he had MRI of thoracic spine with and without contrast on 01/22/18 revaled multilevel spondylosis with central disc protrusion at T10-11 and bilateral foraminal narrowing but no cord compression. An MRI of the cervical spine from 02/09/18 revealed advanced degenerative disc and facet disease but also without cord compression.  He has had left knee surgery.  PAST MEDICAL HISTORY: Past Medical History:  Diagnosis Date  . Anxiety   . GERD  (gastroesophageal reflux disease)   . History of gastric ulcer    2004 APPROX.  . Seasonal allergies   . Urethral stricture    W/ RETENTION  . Wears glasses     MEDICATIONS: Current Outpatient Medications on File Prior to Visit  Medication Sig Dispense Refill  . acetaminophen (TYLENOL) 325 MG tablet Take 650 mg by mouth every 6 (six) hours as needed for moderate pain.    Marland Kitchen ALPRAZolam (XANAX) 0.5 MG tablet Take 0.75 mg by mouth daily with breakfast.     . gabapentin (NEURONTIN) 100 MG capsule Take 1 capsule (100 mg total) by mouth 2 (two) times daily. 180 capsule 3   No current facility-administered medications on file prior to visit.    ALLERGIES: No Known Allergies  FAMILY HISTORY: Family History  Problem Relation Age of Onset  . Heart disease Mother   . Multiple sclerosis Sister   . Healthy Daughter     SOCIAL HISTORY: Social History   Socioeconomic History  . Marital status: Married    Spouse name: Caren Griffins   . Number of children: 1  . Years of education: Not on file  . Highest education level: High school graduate  Occupational History  . Occupation: retired  Tobacco Use  . Smoking status: Never Smoker  . Smokeless tobacco: Never Used  Vaping Use  . Vaping Use: Never used  Substance and Sexual Activity  . Alcohol use: No  . Drug use: No  . Sexual activity: Not on file  Other Topics Concern  . Not on file  Social History Narrative  Married, livves with wife in a 1 story house. Patient is right-handed. Drinks 2 cans of soda a day and an occasional glass of tea with meals. 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   Social Determinants of Health   Financial Resource Strain:   . Difficulty of Paying Living Expenses: Not on file  Food Insecurity:   . Worried About Charity fundraiser in the Last Year: Not on file  . Ran Out of Food in the Last Year: Not on file  Transportation Needs:   .  Lack of Transportation (Medical): Not on file  . Lack of Transportation (Non-Medical): Not on file  Physical Activity:   . Days of Exercise per Week: Not on file  . Minutes of Exercise per Session: Not on file  Stress:   . Feeling of Stress : Not on file  Social Connections:   . Frequency of Communication with Friends and Family: Not on file  . Frequency of Social Gatherings with Friends and Family: Not on file  . Attends Religious Services: Not on file  . Active Member of Clubs or Organizations: Not on file  . Attends Archivist Meetings: Not on file  . Marital Status: Not on file  Intimate Partner Violence:   . Fear of Current or Ex-Partner: Not on file  . Emotionally Abused: Not on file  . Physically Abused: Not on file  . Sexually Abused: Not on file    PHYSICAL EXAM: Blood pressure (!) 143/83, pulse 66, resp. rate 18, height 5\' 6"  (1.676 m), weight 171 lb (77.6 kg), SpO2 99 %. General: No acute distress.  Patient appears well-groomed.   Head:  Normocephalic/atraumatic Eyes:  Fundi examined but not visualized ented to person, place, and time. Attention span and concentration intact, recent and remote memory intact, fund of knowledge intact.  Speech fluent and not dysarthric, language intact.  CN II-XII intact. Bulk and tone normal, muscle strength 5/5 throughout.  Sensation to light touch, temperature and vibration intact.  Deep tendon reflexes 2+ throughout, toes downgoing.  Finger to nose and heel to shin testing intact.  Gait normal, Romberg negative.  IMPRESSION: Bilateral leg pain, stable  PLAN: 1.  Gabapentin 100mg  twice daily 2.  Follow up one year  Metta Clines, DO  CC: Shon Baton, MD

## 2020-11-10 DIAGNOSIS — K5900 Constipation, unspecified: Secondary | ICD-10-CM | POA: Diagnosis not present

## 2020-11-10 DIAGNOSIS — Z86010 Personal history of colonic polyps: Secondary | ICD-10-CM | POA: Diagnosis not present

## 2020-11-10 DIAGNOSIS — K219 Gastro-esophageal reflux disease without esophagitis: Secondary | ICD-10-CM | POA: Diagnosis not present

## 2021-02-02 DIAGNOSIS — H02831 Dermatochalasis of right upper eyelid: Secondary | ICD-10-CM | POA: Diagnosis not present

## 2021-02-02 DIAGNOSIS — H02834 Dermatochalasis of left upper eyelid: Secondary | ICD-10-CM | POA: Diagnosis not present

## 2021-02-02 DIAGNOSIS — H40013 Open angle with borderline findings, low risk, bilateral: Secondary | ICD-10-CM | POA: Diagnosis not present

## 2021-02-02 DIAGNOSIS — H26491 Other secondary cataract, right eye: Secondary | ICD-10-CM | POA: Diagnosis not present

## 2021-02-02 DIAGNOSIS — Z961 Presence of intraocular lens: Secondary | ICD-10-CM | POA: Diagnosis not present

## 2021-02-02 DIAGNOSIS — H25812 Combined forms of age-related cataract, left eye: Secondary | ICD-10-CM | POA: Diagnosis not present

## 2021-02-02 DIAGNOSIS — H04123 Dry eye syndrome of bilateral lacrimal glands: Secondary | ICD-10-CM | POA: Diagnosis not present

## 2021-02-24 DIAGNOSIS — H40013 Open angle with borderline findings, low risk, bilateral: Secondary | ICD-10-CM | POA: Diagnosis not present

## 2021-04-07 ENCOUNTER — Ambulatory Visit: Payer: Medicare HMO | Admitting: Neurology

## 2021-05-25 DIAGNOSIS — H35372 Puckering of macula, left eye: Secondary | ICD-10-CM | POA: Diagnosis not present

## 2021-05-25 DIAGNOSIS — Z961 Presence of intraocular lens: Secondary | ICD-10-CM | POA: Diagnosis not present

## 2021-05-25 DIAGNOSIS — H25812 Combined forms of age-related cataract, left eye: Secondary | ICD-10-CM | POA: Diagnosis not present

## 2021-05-25 DIAGNOSIS — H40013 Open angle with borderline findings, low risk, bilateral: Secondary | ICD-10-CM | POA: Diagnosis not present

## 2021-05-25 DIAGNOSIS — H26491 Other secondary cataract, right eye: Secondary | ICD-10-CM | POA: Diagnosis not present

## 2021-08-12 DIAGNOSIS — R739 Hyperglycemia, unspecified: Secondary | ICD-10-CM | POA: Diagnosis not present

## 2021-08-12 DIAGNOSIS — Z125 Encounter for screening for malignant neoplasm of prostate: Secondary | ICD-10-CM | POA: Diagnosis not present

## 2021-08-12 DIAGNOSIS — E785 Hyperlipidemia, unspecified: Secondary | ICD-10-CM | POA: Diagnosis not present

## 2021-08-19 DIAGNOSIS — R739 Hyperglycemia, unspecified: Secondary | ICD-10-CM | POA: Diagnosis not present

## 2021-08-19 DIAGNOSIS — F419 Anxiety disorder, unspecified: Secondary | ICD-10-CM | POA: Diagnosis not present

## 2021-08-19 DIAGNOSIS — N401 Enlarged prostate with lower urinary tract symptoms: Secondary | ICD-10-CM | POA: Diagnosis not present

## 2021-08-19 DIAGNOSIS — I10 Essential (primary) hypertension: Secondary | ICD-10-CM | POA: Diagnosis not present

## 2021-08-19 DIAGNOSIS — R82998 Other abnormal findings in urine: Secondary | ICD-10-CM | POA: Diagnosis not present

## 2021-08-19 DIAGNOSIS — E785 Hyperlipidemia, unspecified: Secondary | ICD-10-CM | POA: Diagnosis not present

## 2021-08-19 DIAGNOSIS — Z23 Encounter for immunization: Secondary | ICD-10-CM | POA: Diagnosis not present

## 2021-08-19 DIAGNOSIS — Z0000 Encounter for general adult medical examination without abnormal findings: Secondary | ICD-10-CM | POA: Diagnosis not present

## 2021-08-19 DIAGNOSIS — I454 Nonspecific intraventricular block: Secondary | ICD-10-CM | POA: Diagnosis not present

## 2021-08-19 DIAGNOSIS — F1320 Sedative, hypnotic or anxiolytic dependence, uncomplicated: Secondary | ICD-10-CM | POA: Diagnosis not present

## 2021-09-06 NOTE — Progress Notes (Signed)
NEUROLOGY FOLLOW UP OFFICE NOTE  Douglas Taylor [id number]  Assessment/Plan:   1  Bilateral leg pain 2  Unsteady gait, falls - possibly peripheral neuropathy vs lumbosacral radiculopathy - previous lumbar imaging did not reveal significant stenosis  NCV-EMG of lower extremities. Check CK, aldolase, BMP, Mg, B12, TSH Increase gabapentin to 100mg  three times daily Declines physical therapy for now Follow up 6 months.  Subjective:  Douglas Taylor is a 75 year old right-handed male who follows up for bilateral leg pain.   UPDATE: Current medication:  Gabapentin 100mg  twice daily, B12 1035mcg daily  Taking the second dose of gabapentin earlier in evening because the right leg is more painful.  It is an aching pain on anterior and lateral lower leg.  Sometimes has a cramp in the calf.  While sitting, he is okay.  He also reports balance problems.  He has fallen.  He also may bang into the wall.  He reports he veers to either side when walking.  He states a family history of balance problems with his mother.  No numbness.  No back pain.     HISTORY:  In June 2018, he had a left knee scope without complication.  Beginning in September 2018, he has had a progressive pain in both legs.  He reports a deep burning pain.  Initially, it radiated down to the dorsum of both feet but now it only involves the shin, usually from the knee down but occasionally just above the knees.  There is a fairly constant pain that is often dull but he has fluctuations of severity throughout the day.  Sometimes he reports severe knee pain on the right side, sometimes the left side and sometimes both at the same time.  He denies weakness.  He has mild "tinge" in the mid low back but otherwise no significant back pain.  He denies neck pain or pain, numbness, or weakness in the upper extremities.  Due to the pain, his gait is often affected, causing him to hobble.  He reports no change in bowel or bladder function.  He  denies any back or neck injury.   He had an MRI of the lumbar spine on 09/28/17, which demonstrated mild lumbar spondylosis with no evidence of neural foraminal or central canal stenosis.  Venous ultrasound for DVT was negative.  B12 was 256, so he was advised to start 1012mcg daily. Due to hyperreflexia on exam, he had MRI of thoracic spine with and without contrast on 01/22/18 revaled multilevel spondylosis with central disc protrusion at T10-11 and bilateral foraminal narrowing but no cord compression.  An MRI of the cervical spine from 02/09/18 revealed advanced degenerative disc and facet disease but also without cord compression.     He has had left knee surgery.  PAST MEDICAL HISTORY: Past Medical History:  Diagnosis Date   Anxiety    GERD (gastroesophageal reflux disease)    History of gastric ulcer    2004 APPROX.   Seasonal allergies    Urethral stricture    W/ RETENTION   Wears glasses     MEDICATIONS: Current Outpatient Medications on File Prior to Visit  Medication Sig Dispense Refill   acetaminophen (TYLENOL) 325 MG tablet Take 650 mg by mouth every 6 (six) hours as needed for moderate pain.     ALPRAZolam (XANAX) 0.5 MG tablet Take 0.75 mg by mouth daily with breakfast.      gabapentin (NEURONTIN) 100 MG capsule Take 1 capsule (100 mg total) by  mouth 2 (two) times daily. 180 capsule 3   losartan (COZAAR) 25 MG tablet      No current facility-administered medications on file prior to visit.    ALLERGIES: No Known Allergies  FAMILY HISTORY: Family History  Problem Relation Age of Onset   Heart disease Mother    Multiple sclerosis Sister    Healthy Daughter       Objective:  Blood pressure (!) 155/84, pulse 66, height 5\' 5"  (1.651 m), weight 168 lb (76.2 kg), SpO2 97 %. General: No acute distress.  Patient appears well-groomed.   Head:  Normocephalic/atraumatic Eyes:  Fundi examined but not visualized Neck: supple, no paraspinal tenderness, full range of  motion Heart:  Regular rate and rhythm Lungs:  Clear to auscultation bilaterally Back: No paraspinal tenderness Neurological Exam: alert and oriented to person, place, and time.  Speech fluent and not dysarthric, language intact.  CN II-XII intact. Bulk and tone normal, muscle strength 5/5 throughout.  Sensation to vibration reduced in feet, pinprick intact  Deep tendon reflexes 3+ in patellars, otherwise 2+ throughout, toes downgoing.  Finger to nose testing intact.  Gait mildly broad-based.  Unable to tandem walk.  Romberg negative.   Metta Clines, DO  CC: Shon Baton, MD

## 2021-09-07 ENCOUNTER — Ambulatory Visit: Payer: Medicare HMO | Admitting: Neurology

## 2021-09-07 ENCOUNTER — Other Ambulatory Visit (INDEPENDENT_AMBULATORY_CARE_PROVIDER_SITE_OTHER): Payer: Medicare HMO

## 2021-09-07 ENCOUNTER — Encounter: Payer: Self-pay | Admitting: Neurology

## 2021-09-07 ENCOUNTER — Other Ambulatory Visit: Payer: Self-pay

## 2021-09-07 VITALS — BP 155/84 | HR 66 | Ht 65.0 in | Wt 168.0 lb

## 2021-09-07 DIAGNOSIS — R2681 Unsteadiness on feet: Secondary | ICD-10-CM | POA: Diagnosis not present

## 2021-09-07 DIAGNOSIS — M79604 Pain in right leg: Secondary | ICD-10-CM

## 2021-09-07 DIAGNOSIS — M79605 Pain in left leg: Secondary | ICD-10-CM

## 2021-09-07 LAB — BASIC METABOLIC PANEL
BUN: 13 mg/dL (ref 6–23)
CO2: 32 mEq/L (ref 19–32)
Calcium: 9.8 mg/dL (ref 8.4–10.5)
Chloride: 104 mEq/L (ref 96–112)
Creatinine, Ser: 0.96 mg/dL (ref 0.40–1.50)
GFR: 78.23 mL/min (ref >60.00)
Glucose, Bld: 104 mg/dL — ABNORMAL HIGH (ref 70–99)
Potassium: 4.4 mEq/L (ref 3.5–5.1)
Sodium: 143 mEq/L (ref 135–145)

## 2021-09-07 LAB — MAGNESIUM: Magnesium: 2.3 mg/dL (ref 1.5–2.5)

## 2021-09-07 LAB — VITAMIN B12: Vitamin B-12: >1550 pg/mL — ABNORMAL HIGH (ref 211–911)

## 2021-09-07 LAB — TSH: TSH: 2.16 u[IU]/mL (ref 0.35–5.50)

## 2021-09-07 LAB — CK: Total CK: 136 U/L (ref 7–232)

## 2021-09-07 MED ORDER — GABAPENTIN 100 MG PO CAPS: 100.0000 mg | ORAL_CAPSULE | Freq: Three times a day (TID) | ORAL | 5 refills | Status: DC

## 2021-09-07 NOTE — Patient Instructions (Addendum)
Increase gabapentin 100mg  to three times daily Check CK, aldolase, BMP, Mg, B12, TSH Will order nerve conduction study of legs Follow up 6 months.

## 2021-09-08 LAB — ALDOLASE: Aldolase: 4.8 U/L (ref <=8.1)

## 2021-09-14 ENCOUNTER — Other Ambulatory Visit: Payer: Self-pay

## 2021-09-14 ENCOUNTER — Ambulatory Visit: Payer: Medicare HMO | Admitting: Neurology

## 2021-09-14 DIAGNOSIS — M79604 Pain in right leg: Secondary | ICD-10-CM

## 2021-09-14 DIAGNOSIS — M79605 Pain in left leg: Secondary | ICD-10-CM | POA: Diagnosis not present

## 2021-09-14 DIAGNOSIS — R2681 Unsteadiness on feet: Secondary | ICD-10-CM

## 2021-09-14 NOTE — Procedures (Signed)
[facility]  [address], [place]  [place], [place] [address] Tel: [phone] Fax:  [phone] Test Date:  09/14/2021  Patient: Douglas Taylor DOB: [birth year] Physician: Narda Amber, DO  Sex: Male Height: 5\' 5"  Ref Phys: Metta Clines, D.O.  ID#: [id number]   Technician:    Patient Complaints: This is a 75 year old man referred for evaluation of bilateral leg pain and imbalance.  NCV & EMG Findings: Electrodiagnostic testing of the right lower extremity and additional studies of the left shows: Bilateral sural and superficial peroneal sensory responses are within normal limits. Bilateral peroneal and tibial motor responses are within normal limits. Bilateral tibial H reflex studies are within normal limits. There is no evidence of active or chronic motor axonal changes affecting any of the tested muscles.  Motor unit configuration and recruitment pattern is within normal limits.  Impression: This is a normal study of the lower extremities.  In particular, there is no evidence of a sensorimotor polyneuropathy or lumbosacral radiculopathy.   ___________________________ Narda Amber, DO    Nerve Conduction Studies Anti Sensory Summary Table   Stim Site NR Peak (ms) Norm Peak (ms) P-T Amp (V) Norm P-T Amp  Left Sup Peroneal Anti Sensory (Ant Lat Mall)  32C  12 cm    2.6 <4.6 8.9 >3  Right Sup Peroneal Anti Sensory (Ant Lat Mall)  32C  12 cm    2.2 <4.6 6.1 >3  Left Sural Anti Sensory (Lat Mall)  32C  Calf    2.8 <4.6 10.3 >3  Right Sural Anti Sensory (Lat Mall)  32C  Calf    2.6 <4.6 8.5 >3   Motor Summary Table   Stim Site NR Onset (ms) Norm Onset (ms) O-P Amp (mV) Norm O-P Amp Site1 Site2 Delta-0 (ms) Dist (cm) Vel (m/s) Norm Vel (m/s)  Left Peroneal Motor (Ext Dig Brev)  32C  Ankle    2.9 <6.0 3.8 >2.5 B Fib Ankle 7.7 37.0 48 >40  B Fib    10.6  3.5  Poplt B Fib 1.5 8.0 53 >40  Poplt    12.1  3.3         Right Peroneal Motor (Ext Dig Brev)   32C  Ankle    3.4 <6.0 3.7 >2.5 B Fib Ankle 7.5 35.0 47 >40  B Fib    10.9  3.4  Poplt B Fib 1.7 9.0 53 >40  Poplt    12.6  3.2         Left Tibial Motor (Abd Hall Brev)  32C  Ankle    3.5 <6.0 9.0 >4 Knee Ankle 8.3 41.0 49 >40  Knee    11.8  5.9         Right Tibial Motor (Abd Hall Brev)  32C  Ankle    4.1 <6.0 5.5 >4 Knee Ankle 7.9 42.0 53 >40  Knee    12.0  3.7          H Reflex Studies   NR H-Lat (ms) Lat Norm (ms) L-R H-Lat (ms)  Left Tibial (Gastroc)  32C     33.20 <35 1.63  Right Tibial (Gastroc)  32C     31.56 <35 1.63   EMG   Side Muscle Ins Act Fibs Psw Fasc Number Recrt Dur Dur. Amp Amp. Poly Poly. Comment  Left AntTibialis Nml Nml Nml Nml Nml Nml Nml Nml Nml Nml Nml Nml N/A  Left Gastroc Nml Nml Nml Nml Nml Nml Nml Nml Nml Nml Nml  Nml N/A  Left Flex Dig Long Nml Nml Nml Nml Nml Nml Nml Nml Nml Nml Nml Nml N/A  Left RectFemoris Nml Nml Nml Nml Nml Nml Nml Nml Nml Nml Nml Nml N/A  Left GluteusMed Nml Nml Nml Nml Nml Nml Nml Nml Nml Nml Nml Nml N/A  Right AntTibialis Nml Nml Nml Nml Nml Nml Nml Nml Nml Nml Nml Nml N/A  Right Gastroc Nml Nml Nml Nml Nml Nml Nml Nml Nml Nml Nml Nml N/A  Right Flex Dig Long Nml Nml Nml Nml Nml Nml Nml Nml Nml Nml Nml Nml N/A  Right RectFemoris Nml Nml Nml Nml Nml Nml Nml Nml Nml Nml Nml Nml N/A  Right GluteusMed Nml Nml Nml Nml Nml Nml Nml Nml Nml Nml Nml Nml N/A      Waveforms:

## 2021-09-16 ENCOUNTER — Telehealth: Payer: Self-pay

## 2021-09-16 DIAGNOSIS — M4807 Spinal stenosis, lumbosacral region: Secondary | ICD-10-CM

## 2021-09-16 NOTE — Telephone Encounter (Signed)
Pt advised of his EMG results.  Pt agree to having a MRI of lumbar spine w/wo contrast.

## 2021-09-16 NOTE — Telephone Encounter (Signed)
-----   Message from Pieter Partridge, DO sent at 09/16/2021  6:41 AM EDT ----- The nerve study was normal - no evidence of muscle damage or pinched nerves in the back.  Sometimes, you can still have pinched nerves in the back that are not evident on this test.  If agreeable, we can order MRI lumbar spine without contrast to evaluate for lumbar spinal stenosis

## 2021-10-13 ENCOUNTER — Other Ambulatory Visit: Payer: Self-pay

## 2021-10-13 ENCOUNTER — Ambulatory Visit
Admission: RE | Admit: 2021-10-13 | Discharge: 2021-10-13 | Disposition: A | Discharge status: Home / Self Care | Payer: Medicare HMO | Source: Ambulatory Visit | Attending: Neurology | Admitting: Neurology

## 2021-10-13 DIAGNOSIS — M48061 Spinal stenosis, lumbar region without neurogenic claudication: Secondary | ICD-10-CM | POA: Diagnosis not present

## 2021-10-13 DIAGNOSIS — M5127 Other intervertebral disc displacement, lumbosacral region: Secondary | ICD-10-CM | POA: Diagnosis not present

## 2021-10-13 DIAGNOSIS — M4807 Spinal stenosis, lumbosacral region: Secondary | ICD-10-CM

## 2021-10-13 MED ORDER — GADOBENATE DIMEGLUMINE 529 MG/ML IV SOLN
15.0000 mL | Freq: Once | INTRAVENOUS | Status: AC | PRN
  Administered 2021-10-13: 15 mL via INTRAVENOUS

## 2021-10-15 ENCOUNTER — Ambulatory Visit: Payer: Medicare HMO | Admitting: Neurology

## 2021-10-25 ENCOUNTER — Telehealth: Payer: Self-pay | Admitting: Neurology

## 2021-10-25 NOTE — Telephone Encounter (Signed)
LMOVM for pt to call the office back.

## 2021-10-25 NOTE — Telephone Encounter (Signed)
Pt called in to get MRI results

## 2021-10-27 DIAGNOSIS — I10 Essential (primary) hypertension: Secondary | ICD-10-CM | POA: Diagnosis not present

## 2021-10-27 DIAGNOSIS — W19XXXA Unspecified fall, initial encounter: Secondary | ICD-10-CM | POA: Diagnosis not present

## 2021-10-27 DIAGNOSIS — M79606 Pain in leg, unspecified: Secondary | ICD-10-CM | POA: Diagnosis not present

## 2021-10-27 DIAGNOSIS — R2689 Other abnormalities of gait and mobility: Secondary | ICD-10-CM | POA: Diagnosis not present

## 2021-10-27 DIAGNOSIS — R42 Dizziness and giddiness: Secondary | ICD-10-CM | POA: Diagnosis not present

## 2021-10-27 NOTE — Telephone Encounter (Signed)
Called and spoke to patient and informed him of MRI results and recommendations from Dr. Tomi Likens. Patient stated he will call his PCP to be evaluated for other causes as reccommended by Dr. Tomi Likens. Patient had no further questions or concerns.

## 2021-10-29 ENCOUNTER — Other Ambulatory Visit: Payer: Self-pay | Admitting: Registered Nurse

## 2021-10-29 DIAGNOSIS — R2689 Other abnormalities of gait and mobility: Secondary | ICD-10-CM

## 2021-10-29 DIAGNOSIS — R42 Dizziness and giddiness: Secondary | ICD-10-CM

## 2021-11-03 ENCOUNTER — Telehealth: Payer: Self-pay | Admitting: Neurology

## 2021-11-03 NOTE — Telephone Encounter (Signed)
Patient called and said he is almost out of his gabapentin 100 MG.  He said the pharmacy told him they are needing a new prescription.  [facility] on [address] and [address]

## 2021-11-03 NOTE — Telephone Encounter (Signed)
Called pharmacy and approved them to refill due to patient taking 3 times a day vs just 2. Called patient and let him know it would be ready for pick up

## 2021-11-20 ENCOUNTER — Other Ambulatory Visit: Payer: Medicare HMO

## 2021-12-20 ENCOUNTER — Other Ambulatory Visit: Payer: Self-pay

## 2021-12-20 ENCOUNTER — Ambulatory Visit
Admission: RE | Admit: 2021-12-20 | Discharge: 2021-12-20 | Disposition: A | Discharge status: Home / Self Care | Payer: Medicare HMO | Source: Ambulatory Visit | Attending: Registered Nurse | Admitting: Registered Nurse

## 2021-12-20 DIAGNOSIS — R42 Dizziness and giddiness: Secondary | ICD-10-CM | POA: Diagnosis not present

## 2021-12-20 DIAGNOSIS — R2689 Other abnormalities of gait and mobility: Secondary | ICD-10-CM

## 2021-12-20 MED ORDER — GADOBENATE DIMEGLUMINE 529 MG/ML IV SOLN
15.0000 mL | Freq: Once | INTRAVENOUS | Status: AC | PRN
  Administered 2021-12-20: 15 mL via INTRAVENOUS

## 2022-01-25 DIAGNOSIS — K219 Gastro-esophageal reflux disease without esophagitis: Secondary | ICD-10-CM | POA: Diagnosis not present

## 2022-01-25 DIAGNOSIS — K5904 Chronic idiopathic constipation: Secondary | ICD-10-CM | POA: Diagnosis not present

## 2022-01-25 DIAGNOSIS — Z86010 Personal history of colonic polyps: Secondary | ICD-10-CM | POA: Diagnosis not present

## 2022-01-25 DIAGNOSIS — K200 Eosinophilic esophagitis: Secondary | ICD-10-CM | POA: Diagnosis not present

## 2022-02-21 ENCOUNTER — Telehealth: Payer: Self-pay | Admitting: Neurology

## 2022-02-21 NOTE — Telephone Encounter (Signed)
Patient has  been having vertigo and balance issues for the past 2 weeks. He has took dramamine, it has helped some, but would like to know what jaffe thinks ?

## 2022-02-22 DIAGNOSIS — H35372 Puckering of macula, left eye: Secondary | ICD-10-CM | POA: Diagnosis not present

## 2022-02-22 DIAGNOSIS — Z961 Presence of intraocular lens: Secondary | ICD-10-CM | POA: Diagnosis not present

## 2022-02-22 DIAGNOSIS — H40013 Open angle with borderline findings, low risk, bilateral: Secondary | ICD-10-CM | POA: Diagnosis not present

## 2022-02-22 NOTE — Telephone Encounter (Signed)
Tried calling pt the Vertigo looks like a new problem and the balance issues look like dr.Jaffe recommended PT and Patient declined.  ?

## 2022-03-15 NOTE — Progress Notes (Signed)
? ?NEUROLOGY FOLLOW UP OFFICE NOTE ? ?Douglas Taylor ?[id number] ? ?Assessment/Plan:  ? ?1  Bilateral leg pain ?2  Unsteady gait ?3  Hypertension ?  ?Gabapentin '100mg'$  twice daily ?Consider balance classes ?Follow up with PCP re: BP ?Follow up 9 months ?  ?Subjective:  ?Douglas Taylor is a 76 year old right-handed male who follows up for bilateral leg pain. ?  ?UPDATE: ?Current medication:  Gabapentin '100mg'$  twice daily, B12 1068mg daily ? ?Underwent further workup for neuropathy and bilateral leg pain.  Labs from September include CK 136, B12 >1550, and TSH 2.16.  NCV-EMG of lower extremities on 09/14/2021 was normal.  MRI of lumbar spine with and without contrast on 10/13/2021 personally reviewed showed degenerative spine changes similar to 2018 but with discogenic marrow edema at T11-12, L2-3 and L5-S1. ?  ?Increased gabapentin '100mg'$  to three times daily.  Only been taking it twice a day which helps.  No leg pain.   ? ?He reports history of vertigo, usually lasts a day or two.  In January, he had a recurrence of vertigo which lasted 2 weeks.   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  Afterwards, balance actually improved.   ?  ?HISTORY: ? In June 2018, he had a left knee scope without complication.  Beginning in September 2018, he has had a progressive pain in both legs.  He reports a deep burning pain.  Initially, it radiated down to the dorsum of both feet but now it only involves the shin, usually from the knee down but occasionally just above the knees.  There is a fairly constant pain that is often dull but he has fluctuations of severity throughout the day.  Sometimes he reports severe knee pain on the right side, sometimes the left side and sometimes both at the same time.  He denies  weakness.  He has mild "tinge" in the mid low back but otherwise no significant back pain.  He denies neck pain or pain, numbness, or weakness in the upper extremities.  Due to the pain, his gait is often affected, causing him to hobble.  He reports no change in bowel or bladder function.  He denies any back or neck injury. ?  ?He had an MRI of the lumbar spine on 09/28/17, which demonstrated mild lumbar spondylosis with no evidence of neural foraminal or central canal stenosis.  Venous ultrasound for DVT was negative.  B12 was 256, so he was advised to start 10080m daily. ?Due to hyperreflexia on exam, he had MRI of thoracic spine with and without contrast on 01/22/18 revaled multilevel spondylosis with central disc protrusion at T10-11 and bilateral foraminal narrowing but no cord compression.  An MRI of the cervical spine from 02/09/18 revealed advanced degenerative disc and facet disease but also without cord compression.   ?  ?He has had left knee surgery. ? ?PAST MEDICAL HISTORY: ?Past Medical History:  ?Diagnosis Date  ? Anxiety   ? GERD (gastroesophageal reflux disease)   ? History of gastric ulcer   ? 2004 APPROX.  ? Seasonal allergies   ? Urethral stricture   ? W/ RETENTION  ? Wears glasses   ? ? ?MEDICATIONS: ?Current Outpatient Medications on File Prior to Visit  ?Medication Sig Dispense Refill  ? acetaminophen (TYLENOL) 325 MG tablet Take 650 mg  by mouth every 6 (six) hours as needed for moderate pain.    ? ALPRAZolam (XANAX) 0.5 MG tablet Take 0.75 mg by mouth daily with breakfast.     ? gabapentin (NEURONTIN) 100 MG capsule Take 1 capsule (100 mg total) by mouth 3 (three) times daily. 90 capsule 5  ? losartan (COZAAR) 25 MG tablet     ? ?No current facility-administered medications on file prior to visit.  ? ? ?ALLERGIES: ?No Known Allergies ? ?FAMILY HISTORY: ?Family History  ?Problem Relation Age of Onset  ? Heart disease Mother   ? Multiple sclerosis Sister   ? Healthy Daughter   ? ? ?  ?Objective:   ?Blood pressure (!) 162/87, pulse 72, resp. rate 20, height '5\' 7"'$  (1.702 m), weight 172 lb (78 kg), SpO2 97 %. ?General: No acute distress.  Patient appears well-groomed.   ?Head:  Normocephalic/atraumatic ?Eyes:  Fundi examined but not visualized ?Neck: supple, no paraspinal tenderness, full range of motion ?Heart:  Regular rate and rhythm ?Lungs:  Clear to auscultation bilaterally ?Back: No paraspinal tenderness ?Neurological Exam: alert and oriented to person, place, and time.  Speech fluent and not dysarthric, language intact.  CN II-XII intact. Bulk and tone normal, muscle strength 5/5 throughout.  Sensation to light touch intact  Deep tendon reflexes 3+ in patellars, otherwise 2+ throughout.  Finger to nose testing intact.  Gait mildly broad-based. Romberg negative. ? ? ?Douglas Clines, DO ? ?CC: Shon Baton, MD ? ? ? ? ? ? ?

## 2022-03-16 ENCOUNTER — Ambulatory Visit: Payer: Medicare HMO | Admitting: Neurology

## 2022-03-17 ENCOUNTER — Encounter: Payer: Self-pay | Admitting: Neurology

## 2022-03-17 ENCOUNTER — Ambulatory Visit: Payer: Medicare HMO | Admitting: Neurology

## 2022-03-17 VITALS — BP 162/87 | HR 72 | Resp 20 | Ht 67.0 in | Wt 172.0 lb

## 2022-03-17 DIAGNOSIS — M79605 Pain in left leg: Secondary | ICD-10-CM | POA: Diagnosis not present

## 2022-03-17 DIAGNOSIS — I10 Essential (primary) hypertension: Secondary | ICD-10-CM

## 2022-03-17 DIAGNOSIS — R2681 Unsteadiness on feet: Secondary | ICD-10-CM

## 2022-03-17 DIAGNOSIS — M79604 Pain in right leg: Secondary | ICD-10-CM | POA: Diagnosis not present

## 2022-05-23 DIAGNOSIS — S80812A Abrasion, left lower leg, initial encounter: Secondary | ICD-10-CM | POA: Diagnosis not present

## 2022-05-23 DIAGNOSIS — S81802A Unspecified open wound, left lower leg, initial encounter: Secondary | ICD-10-CM | POA: Diagnosis not present

## 2022-05-23 DIAGNOSIS — L03116 Cellulitis of left lower limb: Secondary | ICD-10-CM | POA: Diagnosis not present

## 2022-05-30 DIAGNOSIS — L03116 Cellulitis of left lower limb: Secondary | ICD-10-CM | POA: Diagnosis not present

## 2022-05-30 DIAGNOSIS — S80812A Abrasion, left lower leg, initial encounter: Secondary | ICD-10-CM | POA: Diagnosis not present

## 2022-06-03 DIAGNOSIS — S81802A Unspecified open wound, left lower leg, initial encounter: Secondary | ICD-10-CM | POA: Diagnosis not present

## 2022-06-03 DIAGNOSIS — L03116 Cellulitis of left lower limb: Secondary | ICD-10-CM | POA: Diagnosis not present

## 2022-06-03 DIAGNOSIS — R609 Edema, unspecified: Secondary | ICD-10-CM | POA: Diagnosis not present

## 2022-06-13 DIAGNOSIS — R609 Edema, unspecified: Secondary | ICD-10-CM | POA: Diagnosis not present

## 2022-06-13 DIAGNOSIS — L03116 Cellulitis of left lower limb: Secondary | ICD-10-CM | POA: Diagnosis not present

## 2022-06-13 DIAGNOSIS — S81802A Unspecified open wound, left lower leg, initial encounter: Secondary | ICD-10-CM | POA: Diagnosis not present

## 2022-06-15 DIAGNOSIS — H35372 Puckering of macula, left eye: Secondary | ICD-10-CM | POA: Diagnosis not present

## 2022-06-15 DIAGNOSIS — S81802A Unspecified open wound, left lower leg, initial encounter: Secondary | ICD-10-CM | POA: Diagnosis not present

## 2022-06-15 DIAGNOSIS — H25812 Combined forms of age-related cataract, left eye: Secondary | ICD-10-CM | POA: Diagnosis not present

## 2022-06-15 DIAGNOSIS — H40013 Open angle with borderline findings, low risk, bilateral: Secondary | ICD-10-CM | POA: Diagnosis not present

## 2022-06-15 DIAGNOSIS — Z961 Presence of intraocular lens: Secondary | ICD-10-CM | POA: Diagnosis not present

## 2022-06-22 DIAGNOSIS — R609 Edema, unspecified: Secondary | ICD-10-CM | POA: Diagnosis not present

## 2022-06-22 DIAGNOSIS — S81802D Unspecified open wound, left lower leg, subsequent encounter: Secondary | ICD-10-CM | POA: Diagnosis not present

## 2022-06-22 DIAGNOSIS — L03116 Cellulitis of left lower limb: Secondary | ICD-10-CM | POA: Diagnosis not present

## 2022-06-22 DIAGNOSIS — T8131XD Disruption of external operation (surgical) wound, not elsewhere classified, subsequent encounter: Secondary | ICD-10-CM | POA: Diagnosis not present

## 2022-06-29 DIAGNOSIS — T8131XD Disruption of external operation (surgical) wound, not elsewhere classified, subsequent encounter: Secondary | ICD-10-CM | POA: Diagnosis not present

## 2022-06-29 DIAGNOSIS — S81802D Unspecified open wound, left lower leg, subsequent encounter: Secondary | ICD-10-CM | POA: Diagnosis not present

## 2022-06-29 DIAGNOSIS — L03116 Cellulitis of left lower limb: Secondary | ICD-10-CM | POA: Diagnosis not present

## 2022-06-29 DIAGNOSIS — R609 Edema, unspecified: Secondary | ICD-10-CM | POA: Diagnosis not present

## 2022-07-06 DIAGNOSIS — L03116 Cellulitis of left lower limb: Secondary | ICD-10-CM | POA: Diagnosis not present

## 2022-07-06 DIAGNOSIS — T8131XD Disruption of external operation (surgical) wound, not elsewhere classified, subsequent encounter: Secondary | ICD-10-CM | POA: Diagnosis not present

## 2022-07-06 DIAGNOSIS — R609 Edema, unspecified: Secondary | ICD-10-CM | POA: Diagnosis not present

## 2022-07-06 DIAGNOSIS — S81802A Unspecified open wound, left lower leg, initial encounter: Secondary | ICD-10-CM | POA: Diagnosis not present

## 2022-07-07 DIAGNOSIS — S81802A Unspecified open wound, left lower leg, initial encounter: Secondary | ICD-10-CM | POA: Diagnosis not present

## 2022-07-14 DIAGNOSIS — S81802D Unspecified open wound, left lower leg, subsequent encounter: Secondary | ICD-10-CM | POA: Diagnosis not present

## 2022-07-14 DIAGNOSIS — L03116 Cellulitis of left lower limb: Secondary | ICD-10-CM | POA: Diagnosis not present

## 2022-07-14 DIAGNOSIS — R609 Edema, unspecified: Secondary | ICD-10-CM | POA: Diagnosis not present

## 2022-07-18 DIAGNOSIS — H04123 Dry eye syndrome of bilateral lacrimal glands: Secondary | ICD-10-CM | POA: Diagnosis not present

## 2022-07-18 DIAGNOSIS — H02831 Dermatochalasis of right upper eyelid: Secondary | ICD-10-CM | POA: Diagnosis not present

## 2022-07-18 DIAGNOSIS — Z961 Presence of intraocular lens: Secondary | ICD-10-CM | POA: Diagnosis not present

## 2022-07-18 DIAGNOSIS — H40013 Open angle with borderline findings, low risk, bilateral: Secondary | ICD-10-CM | POA: Diagnosis not present

## 2022-07-18 DIAGNOSIS — H35372 Puckering of macula, left eye: Secondary | ICD-10-CM | POA: Diagnosis not present

## 2022-07-18 DIAGNOSIS — H02834 Dermatochalasis of left upper eyelid: Secondary | ICD-10-CM | POA: Diagnosis not present

## 2022-07-21 DIAGNOSIS — R609 Edema, unspecified: Secondary | ICD-10-CM | POA: Diagnosis not present

## 2022-07-21 DIAGNOSIS — L03116 Cellulitis of left lower limb: Secondary | ICD-10-CM | POA: Diagnosis not present

## 2022-07-21 DIAGNOSIS — S81802D Unspecified open wound, left lower leg, subsequent encounter: Secondary | ICD-10-CM | POA: Diagnosis not present

## 2022-07-27 DIAGNOSIS — R609 Edema, unspecified: Secondary | ICD-10-CM | POA: Diagnosis not present

## 2022-07-27 DIAGNOSIS — S81802D Unspecified open wound, left lower leg, subsequent encounter: Secondary | ICD-10-CM | POA: Diagnosis not present

## 2022-07-29 ENCOUNTER — Other Ambulatory Visit: Payer: Self-pay | Admitting: Neurology

## 2022-08-03 DIAGNOSIS — L97929 Non-pressure chronic ulcer of unspecified part of left lower leg with unspecified severity: Secondary | ICD-10-CM | POA: Diagnosis not present

## 2022-08-03 DIAGNOSIS — R609 Edema, unspecified: Secondary | ICD-10-CM | POA: Diagnosis not present

## 2022-08-03 DIAGNOSIS — S81802D Unspecified open wound, left lower leg, subsequent encounter: Secondary | ICD-10-CM | POA: Diagnosis not present

## 2022-08-03 DIAGNOSIS — I83029 Varicose veins of left lower extremity with ulcer of unspecified site: Secondary | ICD-10-CM | POA: Diagnosis not present

## 2022-08-09 DIAGNOSIS — R609 Edema, unspecified: Secondary | ICD-10-CM | POA: Diagnosis not present

## 2022-08-09 DIAGNOSIS — L03116 Cellulitis of left lower limb: Secondary | ICD-10-CM | POA: Diagnosis not present

## 2022-08-09 DIAGNOSIS — S81802D Unspecified open wound, left lower leg, subsequent encounter: Secondary | ICD-10-CM | POA: Diagnosis not present

## 2022-08-17 DIAGNOSIS — R609 Edema, unspecified: Secondary | ICD-10-CM | POA: Diagnosis not present

## 2022-08-17 DIAGNOSIS — T8131XD Disruption of external operation (surgical) wound, not elsewhere classified, subsequent encounter: Secondary | ICD-10-CM | POA: Diagnosis not present

## 2022-08-17 DIAGNOSIS — T8133XD Disruption of traumatic injury wound repair, subsequent encounter: Secondary | ICD-10-CM | POA: Diagnosis not present

## 2022-08-25 DIAGNOSIS — R739 Hyperglycemia, unspecified: Secondary | ICD-10-CM | POA: Diagnosis not present

## 2022-08-25 DIAGNOSIS — E785 Hyperlipidemia, unspecified: Secondary | ICD-10-CM | POA: Diagnosis not present

## 2022-08-25 DIAGNOSIS — R7989 Other specified abnormal findings of blood chemistry: Secondary | ICD-10-CM | POA: Diagnosis not present

## 2022-08-25 DIAGNOSIS — I10 Essential (primary) hypertension: Secondary | ICD-10-CM | POA: Diagnosis not present

## 2022-08-25 DIAGNOSIS — F419 Anxiety disorder, unspecified: Secondary | ICD-10-CM | POA: Diagnosis not present

## 2022-08-25 DIAGNOSIS — Z125 Encounter for screening for malignant neoplasm of prostate: Secondary | ICD-10-CM | POA: Diagnosis not present

## 2022-08-31 DIAGNOSIS — T8131XD Disruption of external operation (surgical) wound, not elsewhere classified, subsequent encounter: Secondary | ICD-10-CM | POA: Diagnosis not present

## 2022-08-31 DIAGNOSIS — R609 Edema, unspecified: Secondary | ICD-10-CM | POA: Diagnosis not present

## 2022-08-31 DIAGNOSIS — T8133XD Disruption of traumatic injury wound repair, subsequent encounter: Secondary | ICD-10-CM | POA: Diagnosis not present

## 2022-09-01 DIAGNOSIS — M1990 Unspecified osteoarthritis, unspecified site: Secondary | ICD-10-CM | POA: Diagnosis not present

## 2022-09-01 DIAGNOSIS — R739 Hyperglycemia, unspecified: Secondary | ICD-10-CM | POA: Diagnosis not present

## 2022-09-01 DIAGNOSIS — F419 Anxiety disorder, unspecified: Secondary | ICD-10-CM | POA: Diagnosis not present

## 2022-09-01 DIAGNOSIS — R82998 Other abnormal findings in urine: Secondary | ICD-10-CM | POA: Diagnosis not present

## 2022-09-01 DIAGNOSIS — Z1212 Encounter for screening for malignant neoplasm of rectum: Secondary | ICD-10-CM | POA: Diagnosis not present

## 2022-09-01 DIAGNOSIS — I10 Essential (primary) hypertension: Secondary | ICD-10-CM | POA: Diagnosis not present

## 2022-09-01 DIAGNOSIS — E785 Hyperlipidemia, unspecified: Secondary | ICD-10-CM | POA: Diagnosis not present

## 2022-09-01 DIAGNOSIS — Z0000 Encounter for general adult medical examination without abnormal findings: Secondary | ICD-10-CM | POA: Diagnosis not present

## 2022-09-01 DIAGNOSIS — Z23 Encounter for immunization: Secondary | ICD-10-CM | POA: Diagnosis not present

## 2022-09-01 DIAGNOSIS — F1320 Sedative, hypnotic or anxiolytic dependence, uncomplicated: Secondary | ICD-10-CM | POA: Diagnosis not present

## 2022-09-01 DIAGNOSIS — E663 Overweight: Secondary | ICD-10-CM | POA: Diagnosis not present

## 2022-10-11 DIAGNOSIS — I10 Essential (primary) hypertension: Secondary | ICD-10-CM | POA: Diagnosis not present

## 2022-10-11 DIAGNOSIS — H6123 Impacted cerumen, bilateral: Secondary | ICD-10-CM | POA: Diagnosis not present

## 2022-10-11 DIAGNOSIS — H9193 Unspecified hearing loss, bilateral: Secondary | ICD-10-CM | POA: Diagnosis not present

## 2022-10-13 ENCOUNTER — Other Ambulatory Visit: Payer: Self-pay | Admitting: Neurology

## 2022-11-11 DIAGNOSIS — E785 Hyperlipidemia, unspecified: Secondary | ICD-10-CM | POA: Diagnosis not present

## 2022-11-11 DIAGNOSIS — I10 Essential (primary) hypertension: Secondary | ICD-10-CM | POA: Diagnosis not present

## 2022-11-14 DIAGNOSIS — H40013 Open angle with borderline findings, low risk, bilateral: Secondary | ICD-10-CM | POA: Diagnosis not present

## 2022-11-14 DIAGNOSIS — H35372 Puckering of macula, left eye: Secondary | ICD-10-CM | POA: Diagnosis not present

## 2022-11-14 DIAGNOSIS — Z961 Presence of intraocular lens: Secondary | ICD-10-CM | POA: Diagnosis not present

## 2022-12-19 ENCOUNTER — Ambulatory Visit: Payer: Medicare HMO | Admitting: Neurology

## 2022-12-29 ENCOUNTER — Other Ambulatory Visit: Payer: Self-pay | Admitting: Neurology

## 2023-01-16 NOTE — Progress Notes (Unsigned)
NEUROLOGY FOLLOW UP OFFICE NOTE  Douglas Taylor [id number]  Assessment/Plan:   1  Unsteady gait.  May be related to underlying neuropathy.  I think worse due to decompensation from prolonged sedentary lifestyle following leg injury.   Refer to physical therapy. Gabapentin '100mg'$  twice daily Consider balance classes Follow up with PCP re: BP Follow up 9 months   Subjective:  Douglas Taylor is a 77 year old right-handed male who follows up for bilateral leg pain.   UPDATE: Current medication:  Gabapentin '100mg'$  twice daily, B12 1059mg daily  In June, he sustained a puncture wound to the back of his left lower leg which caused infection.  He was off of his feet for 3 months.  Balance has been worse since then.  Continues to feel unsteady when ambulating.  FSan [place]3 times since last visit.  Not associated with weakness or numbness.  No double vision.  This is separate from the vertigo.       HISTORY:  In June 2018, he had a left knee scope without complication.  Beginning in September 2018, he has had a progressive pain in both legs.  He reports a deep burning pain.  Initially, it radiated down to the dorsum of both feet but now it only involves the shin, usually from the knee down but occasionally just above the knees.  There is a fairly constant pain that is often dull but he has fluctuations of severity throughout the day.  Sometimes he reports severe knee pain on the right side, sometimes the left side and sometimes both at the same time.  He denies weakness.  He has mild "tinge" in the mid low back but otherwise no significant back pain.  He denies neck pain or pain, numbness, or weakness in the upper extremities.  Due to the pain, his gait is often affected, causing him to hobble.  He reports no change in bowel or bladder function.  He denies any back or neck injury.  He had an MRI of the lumbar spine on 09/28/17, which demonstrated mild lumbar spondylosis with no evidence of neural  foraminal or central canal stenosis.  Venous ultrasound for DVT was negative.  B12 was 256, so he was advised to start 10023m daily.  Due to hyperreflexia on exam, he had MRI of thoracic spine with and without contrast on 01/22/18 revealed multilevel spondylosis with central disc protrusion at T10-11 and bilateral foraminal narrowing but no cord compression.  An MRI of the cervical spine from 02/09/18 revealed advanced degenerative disc and facet disease but also without cord compression. Underwent labs for neuropathy.   Labs from September  2022 include CK 136, B12 >1550, and TSH 2.16.  NCV-EMG of lower extremities on 09/14/2021 was normal.  MRI of lumbar spine with and without contrast on 10/13/2021 howed degenerative spine changes similar to 2018 but with discogenic marrow edema at T11-12, L2-3 and L5-S1.   He reports history of vertigo, usually lasts a day or two.  In January 2023, he had a recurrence of vertigo which lasted 2 weeks.   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  Afterwards, balance actually improved.    PAST MEDICAL HISTORY: Past Medical History:  Diagnosis Date   Anxiety    GERD (gastroesophageal reflux disease)    History of gastric ulcer  2004 APPROX.   Seasonal allergies    Urethral stricture    W/ RETENTION   Wears glasses     MEDICATIONS: Current Outpatient Medications on File Prior to Visit  Medication Sig Dispense Refill   acetaminophen (TYLENOL) 325 MG tablet Take 650 mg by mouth every 6 (six) hours as needed for moderate pain.     ALPRAZolam (XANAX) 0.5 MG tablet Take 0.75 mg by mouth daily with breakfast.      gabapentin (NEURONTIN) 100 MG capsule TAKE 1 CAPSULE(100 MG) BY MOUTH THREE TIMES DAILY 90 capsule 0   losartan (COZAAR) 25 MG tablet       No current facility-administered medications on file prior to visit.    ALLERGIES: No Known Allergies  FAMILY HISTORY: Family History  Problem Relation Age of Onset   Heart disease Mother    Multiple sclerosis Sister    Healthy Daughter       Objective:  Blood pressure 122/88, pulse 98, height '5\' 8"'$  (1.727 m), weight 177 lb (80.3 kg), SpO2 98 %. General: No acute distress.  Patient appears well-groomed.   Head:  Normocephalic/atraumatic Eyes:  Fundi examined but not visualized Neck: supple, no paraspinal tenderness, full range of motion Heart:  Regular rate and rhythm Back: No paraspinal tenderness Neurological Exam: alert and oriented to person, place, and time.  Speech fluent and not dysarthric, language intact.  CN II-XII intact. Bulk and tone normal, muscle strength 5/5 throughout.  Sensation to pinprick intact, vibratory sensation reduced in right foot, slightly reduced in left foot.  Deep tendon reflexes 3+  in patellars, otherwise 2+ throughout.  Finger to nose testing intact.  Gait mildly broad-based, unable to tandem walk. Romberg negative.  Metta Clines, DO  CC: Shon Baton, MD

## 2023-01-17 ENCOUNTER — Encounter: Payer: Self-pay | Admitting: Neurology

## 2023-01-17 ENCOUNTER — Ambulatory Visit: Payer: Medicare HMO | Admitting: Neurology

## 2023-01-17 VITALS — BP 122/88 | HR 98 | Ht 68.0 in | Wt 177.0 lb

## 2023-01-17 DIAGNOSIS — M79605 Pain in left leg: Secondary | ICD-10-CM | POA: Diagnosis not present

## 2023-01-17 DIAGNOSIS — R2681 Unsteadiness on feet: Secondary | ICD-10-CM

## 2023-01-17 DIAGNOSIS — M79604 Pain in right leg: Secondary | ICD-10-CM | POA: Diagnosis not present

## 2023-01-17 NOTE — Patient Instructions (Signed)
Refer to physical therapy for gait instability Continue gabapentin Follow up 8 months.

## 2023-01-19 ENCOUNTER — Ambulatory Visit: Payer: Medicare HMO | Attending: Neurology | Admitting: Rehabilitation

## 2023-01-19 ENCOUNTER — Encounter: Payer: Self-pay | Admitting: Rehabilitation

## 2023-01-19 DIAGNOSIS — R2681 Unsteadiness on feet: Secondary | ICD-10-CM | POA: Diagnosis not present

## 2023-01-19 DIAGNOSIS — M79605 Pain in left leg: Secondary | ICD-10-CM | POA: Diagnosis not present

## 2023-01-19 DIAGNOSIS — M79604 Pain in right leg: Secondary | ICD-10-CM | POA: Insufficient documentation

## 2023-01-19 DIAGNOSIS — M6281 Muscle weakness (generalized): Secondary | ICD-10-CM | POA: Insufficient documentation

## 2023-01-19 DIAGNOSIS — R42 Dizziness and giddiness: Secondary | ICD-10-CM | POA: Insufficient documentation

## 2023-01-19 DIAGNOSIS — R296 Repeated falls: Secondary | ICD-10-CM | POA: Diagnosis not present

## 2023-01-19 NOTE — Therapy (Signed)
OUTPATIENT PHYSICAL THERAPY NEURO EVALUATION   Patient Name: Douglas Taylor MRN: [id number] DOB:Dec 01, 1946, 77 y.o., male Today's Date: 01/19/2023   PCP: Shon Baton, MD REFERRING PROVIDER: Metta Clines, DO  END OF SESSION:  PT End of Session - 01/19/23 0826     Visit Number 1    Number of Visits 9   including eval   Date for PT Re-Evaluation 03/20/23    Authorization Type [employer] (10th visit PN needed)    Progress Note Due on Visit 10    PT Start Time 0825    PT Stop Time 0915    PT Time Calculation (min) 50 min    Activity Tolerance Patient tolerated treatment well    Behavior During Therapy [facility] for tasks assessed/performed             Past Medical History:  Diagnosis Date   Anxiety    GERD (gastroesophageal reflux disease)    History of gastric ulcer    2004 APPROX.   Seasonal allergies    Urethral stricture    W/ RETENTION   Wears glasses    Past Surgical History:  Procedure Laterality Date   CYSTOSCOPY WITH RETROGRADE URETHROGRAM N/A 05/07/2015   Procedure: CYSTOSCOPY WITH RETROGRADE URETHROGRAM;  Surgeon: Alexis Frock, MD;  Location: [facility];  Service: Urology;  Laterality: N/A;   CYSTOSCOPY WITH URETHRAL DILATATION N/A 05/07/2015   Procedure: CYSTOSCOPY WITH  BALLOON URETHRAL DILATATION;  Surgeon: Alexis Frock, MD;  [facility];  Service: Urology;  Laterality: N/A;   INGUINAL HERNIA REPAIR Right 04/10/2015   Procedure: LAPAROSCOPIC INGUINAL HERNIA REPAIR WITH MESH;  Surgeon: Ralene Ok, MD;  Location: [facility];  Service: General;  Laterality: Right;   INSERTION OF MESH Right 04/10/2015   Procedure: INSERTION OF MESH;  Surgeon: Ralene Ok, MD;  Location: [facility];  Service: General;  Laterality: Right;   KNEE ARTHROSCOPY Left    There are no problems to display for this patient.   ONSET DATE: 01/17/2023 (referral date)  REFERRING DIAG:  Diagnosis  R26.81 (ICD-10-CM) - Unsteady gait   M79.604,M79.605 (ICD-10-CM) - Bilateral leg pain    THERAPY DIAG:  Unsteadiness on feet  Rationale for Evaluation and Treatment: Rehabilitation  SUBJECTIVE:                                                                                                                                                                                             SUBJECTIVE STATEMENT: "They've done all sorts of things to me, the nerve study and MRI.  They can't seem to find what's wrong with me.  I  had a wound to my leg last year where I got a metal rod in my leg from fixing my ramp for my wife (who is in a w/c from [place]).  When that happened I was off my feet for about 3 months.  My balance is just awful and I've fallen 3 times."   Pt accompanied by: self  PERTINENT HISTORY:  MRI of the cervical spine from 02/09/18 revealed advanced degenerative disc and facet disease but also without cord compression. Underwent labs for neuropathy.   Labs from September  2022 include CK 136, B12 >1550, and TSH 2.16.  NCV-EMG of lower extremities on 09/14/2021 was normal.  MRI of lumbar spine with and without contrast on 10/13/2021 howed degenerative spine changes similar to 2018 but with discogenic marrow edema at T11-12, L2-3 and L5-S1.  PAIN:  Are you having pain? No  PRECAUTIONS: Fall  WEIGHT BEARING RESTRICTIONS: No  FALLS: Has patient fallen in last 6 months? Yes. Number of falls 3  LIVING ENVIRONMENT: Lives with: lives with their spouse Lives in: House/apartment Stairs: No Has following equipment at home:  none for himself, but wife has lots of equipment, has grab bars in showers that he does use because he feels quite unsteady when in shower closing eyes to rinse face/head.   PLOF: Independent with basic ADLs, Independent with household mobility without device, Independent with community mobility without device, Independent with gait, and Independent with transfers  PATIENT GOALS: "To get back to being steady on my  feet."   OBJECTIVE:   DIAGNOSTIC FINDINGS: see above  COGNITION: Overall cognitive status: Within functional limits for tasks assessed   SENSATION: Light touch: Impaired Has history of neuropathy in feet, reports its not as bad as it was in the past.   COORDINATION: WFL   POSTURE: No Significant postural limitations  LOWER EXTREMITY ROM:      Right Eval Left Eval  Hip flexion    Hip extension    Hip abduction    Hip adduction    Hip internal rotation    Hip external rotation    Knee flexion    Knee extension    Ankle dorsiflexion    Ankle plantarflexion    Ankle inversion    Ankle eversion     (Blank rows = not tested) All grossly WFL in seated position   LOWER EXTREMITY MMT:    MMT Right Eval Left Eval  Hip flexion 5/5 5/5  Hip extension    Hip abduction 5/5 5/5  Hip adduction 5/5 5/5  Hip internal rotation    Hip external rotation    Knee flexion 5/5 5/5  Knee extension 5/5 5/5  Ankle dorsiflexion 4/5 5/5  Ankle plantarflexion    Ankle inversion    Ankle eversion    (Blank rows = not tested)All tested in seated position, seems to have some R hip weakness during gait.    TRANSFERS: Assistive device utilized: None  Sit to stand: Modified independence Stand to sit: Modified independence Chair to chair: Modified independence Floor:  n/a but reports he is able to do at home    STAIRS: Level of Assistance: SBA Stair Negotiation Technique: Alternating Pattern  with Single Rail on Right Number of Stairs: 4  Height of Stairs: 6  Comments: No difficulties with stairs  GAIT: Gait pattern: step through pattern, decreased stride length, trendelenburg, lateral hip instability, trunk flexed, and narrow BOS R LE ER at times  Distance walked: 200' during session Assistive device utilized: None  Level of assistance: SBA and CGA during balance testing, otherwise S to mod I  Comments: Note mild hip weakness during gait at times, some RLE ER at times.  During  balance challenges needs to slow down or use mild step strategy to self recover.   FUNCTIONAL TESTS:  5 times sit to stand: 8.22 secs without UE support from standard arm chair 10 meter walk test: 3.22 ft/sec without device  Functional gait assessment: 20/30    TODAY'S TREATMENT:                                                                                                                              DATE: none initiated     PATIENT EDUCATION: Education details: Educated on POC, goals, looking closer at vestibular deficits at next visit.  Person educated: Patient Education method: Explanation and Verbal cues Education comprehension: verbalized understanding and needs further education  HOME EXERCISE PROGRAM: None initiated today   GOALS: Goals reviewed with patient? Yes  SHORT TERM GOALS: Target date: 02/17/23  Pt will be IND with initial HEP in order to indicate improved functional mobility and dec fall risk. Baseline: Goal status: INITIAL  2.  Pt will improve FGA to >/=23/30 in order to indicate dec fall risk.   Baseline: 20/30 Goal status: INITIAL  3.  Pt will maintain gait speed of at least 3.0 ft/sec without evidence of compensation/LE ER, less fatigue in order to indicate improved balance/gait.  Baseline:  Goal status: INITIAL  4.  Will assess SOT and write updated STG/LTG to reflect progress.  Baseline:  Goal status: INITIAL  5.  Will do formal vestibular assessment and add any appropriate goals to address deficits.  Baseline:  Goal status: INITIAL  6.  Pt will negotiate up/down 4 steps with single rail, up/down ramp and curb and ambulate x 500' outdoors over unlevel paved surfaces and grassy surfaces at S level in order to indicate improved community mobility.   Baseline:  Goal status: INITIAL  LONG TERM GOALS: Target date: 03/20/23  Pt will be IND with HEP in order to indicate improved functional mobility and dec fall risk. Baseline:  Goal status:  INITIAL  2.  Pt will improve FGA to >/=26/30 in order to indicate dec fall risk.   Baseline: 20/30 Goal status: INITIAL  3.  Will add SOT goal once assessed.  Baseline:  Goal status: INITIAL  4.  Pt will negotiate up/down 12 steps with single rail, up/down ramp and curb and ambulate x 1000' outdoors over varying surfaces (paved and grassy) at mod I level in order to indicate improved community mobility.   Baseline:  Goal status: INITIAL    ASSESSMENT:  CLINICAL IMPRESSION: Patient is a 77 y.o. male who was seen today for physical therapy evaluation and treatment for unsteady gait.  Note history of MRI of the cervical spine from 02/09/18 revealed advanced degenerative disc and facet disease but also without cord compression. Underwent labs  for neuropathy.   Labs from September  2022 include CK 136, B12 >1550, and TSH 2.16.  NCV-EMG of lower extremities on 09/14/2021 was normal.  MRI of lumbar spine with and without contrast on 10/13/2021 howed degenerative spine changes similar to 2018 but with discogenic marrow edema at T11-12, L2-3 and L5-S1.In June, he sustained a puncture wound to the back of his left lower leg which caused infection.  He was off of his feet for 3 months.  Balance has been worse since then.  Continues to feel unsteady when ambulating.  [place] 3 times since last visit. Upon PT evaluation, note gait speed and 5TSS was [facility] however with some mild gait deviations noted.  FGA was 20/30 indicative of medium fall risk.  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  Pt will benefit from skilled OP neuro PT in order to address deficits.   OBJECTIVE IMPAIRMENTS: Abnormal gait, decreased balance, decreased mobility, difficulty walking, decreased strength, dizziness, impaired perceived functional ability, impaired sensation, and postural dysfunction.   ACTIVITY LIMITATIONS:  stairs, transfers, and locomotion level  PARTICIPATION LIMITATIONS: cleaning, community activity, occupation, and yard work  PERSONAL FACTORS: Age, Time since onset of injury/illness/exacerbation, and 3+ comorbidities: see above  are also affecting patient's functional outcome.   REHAB POTENTIAL: Excellent  CLINICAL DECISION MAKING: Evolving/moderate complexity  EVALUATION COMPLEXITY: Moderate  PLAN:  PT FREQUENCY: 1-2x/week  PT DURATION: 8 weeks  PLANNED INTERVENTIONS: Therapeutic exercises, Therapeutic activity, Neuromuscular re-education, Balance training, Gait training, Patient/Family education, Self Care, Vestibular training, and [place] repositioning  PLAN FOR NEXT SESSION: SOT (update goals if needed), give HEP based on deficits, do a formal vestibular eval as he reports having vertigo about once every other month, last one being Sunday 2/4 and only taking dramamine to treat.  Add RLE strength and continue to add high level balance with turns as he works at the [employer] in high level job.    Cameron Sprang, PT, MPT [facility] [address] [place], [place], [address] Phone: [phone]   Fax:  [phone] 01/19/23, 9:18 AM

## 2023-01-23 DIAGNOSIS — H40013 Open angle with borderline findings, low risk, bilateral: Secondary | ICD-10-CM | POA: Diagnosis not present

## 2023-01-23 DIAGNOSIS — Z961 Presence of intraocular lens: Secondary | ICD-10-CM | POA: Diagnosis not present

## 2023-01-23 DIAGNOSIS — H33191 Other retinoschisis and retinal cysts, right eye: Secondary | ICD-10-CM | POA: Diagnosis not present

## 2023-01-23 DIAGNOSIS — H2512 Age-related nuclear cataract, left eye: Secondary | ICD-10-CM | POA: Diagnosis not present

## 2023-01-23 DIAGNOSIS — H25812 Combined forms of age-related cataract, left eye: Secondary | ICD-10-CM | POA: Diagnosis not present

## 2023-01-23 DIAGNOSIS — H35372 Puckering of macula, left eye: Secondary | ICD-10-CM | POA: Diagnosis not present

## 2023-01-27 ENCOUNTER — Ambulatory Visit: Payer: Medicare HMO

## 2023-01-27 DIAGNOSIS — M6281 Muscle weakness (generalized): Secondary | ICD-10-CM

## 2023-01-27 DIAGNOSIS — R42 Dizziness and giddiness: Secondary | ICD-10-CM | POA: Diagnosis not present

## 2023-01-27 DIAGNOSIS — M79605 Pain in left leg: Secondary | ICD-10-CM | POA: Diagnosis not present

## 2023-01-27 DIAGNOSIS — R2681 Unsteadiness on feet: Secondary | ICD-10-CM

## 2023-01-27 DIAGNOSIS — R296 Repeated falls: Secondary | ICD-10-CM

## 2023-01-27 DIAGNOSIS — M79604 Pain in right leg: Secondary | ICD-10-CM | POA: Diagnosis not present

## 2023-01-27 NOTE — Therapy (Signed)
OUTPATIENT PHYSICAL THERAPY NEURO EVALUATION   Patient Name: Douglas Taylor MRN: [id number] DOB:December 25, 1945, 77 y.o., male Today's Date: 01/27/2023   PCP: Shon Baton, MD REFERRING PROVIDER: Metta Clines, DO  END OF SESSION:  PT End of Session - 01/27/23 1317     Visit Number 2    Number of Visits 9    Date for PT Re-Evaluation 03/20/23    Authorization Type [employer] (10th visit PN needed)    Progress Note Due on Visit 10    PT Start Time 1316    PT Stop Time 1358    PT Time Calculation (min) 42 min    Activity Tolerance Patient tolerated treatment well    Behavior During Therapy WFL for tasks assessed/performed             Past Medical History:  Diagnosis Date   Anxiety    GERD (gastroesophageal reflux disease)    History of gastric ulcer    2004 APPROX.   Seasonal allergies    Urethral stricture    W/ RETENTION   Wears glasses    Past Surgical History:  Procedure Laterality Date   CYSTOSCOPY WITH RETROGRADE URETHROGRAM N/A 05/07/2015   Procedure: CYSTOSCOPY WITH RETROGRADE URETHROGRAM;  Surgeon: Alexis Frock, MD;  Location: [facility];  Service: Urology;  Laterality: N/A;   CYSTOSCOPY WITH URETHRAL DILATATION N/A 05/07/2015   Procedure: CYSTOSCOPY WITH  BALLOON URETHRAL DILATATION;  Surgeon: Alexis Frock, MD;  Location: [facility];  Service: Urology;  Laterality: N/A;   INGUINAL HERNIA REPAIR Right 04/10/2015   Procedure: LAPAROSCOPIC INGUINAL HERNIA REPAIR WITH MESH;  Surgeon: Ralene Ok, MD;  Location: [facility];  Service: General;  Laterality: Right;   INSERTION OF MESH Right 04/10/2015   Procedure: INSERTION OF MESH;  Surgeon: Ralene Ok, MD;  Location: [facility];  Service: General;  Laterality: Right;   KNEE ARTHROSCOPY Left    There are no problems to display for this patient.   ONSET DATE: 01/17/2023 (referral date)  REFERRING DIAG:  Diagnosis  R26.81 (ICD-10-CM) - Unsteady gait  M79.604,M79.605  (ICD-10-CM) - Bilateral leg pain    THERAPY DIAG:  Unsteadiness on feet  Muscle weakness (generalized)  Repeated falls  Dizziness and giddiness  Rationale for Evaluation and Treatment: Rehabilitation  SUBJECTIVE:                                                                                                                                                                                             SUBJECTIVE STATEMENT: Patient reports doing well. Does report intermittent scissoring in his gait. Last episode of vertigo was 2  Sundays ago. Reports ease walking over even surfaces, but increased difficulty over uneven surfaces.  Pt accompanied by: self  PERTINENT HISTORY:  MRI of the cervical spine from 02/09/18 revealed advanced degenerative disc and facet disease but also without cord compression. Underwent labs for neuropathy.   Labs from September  2022 include CK 136, B12 >1550, and TSH 2.16.  NCV-EMG of lower extremities on 09/14/2021 was normal.  MRI of lumbar spine with and without contrast on 10/13/2021 howed degenerative spine changes similar to 2018 but with discogenic marrow edema at T11-12, L2-3 and L5-S1.  PAIN:  Are you having pain? No  PRECAUTIONS: Fall  PATIENT GOALS: "To get back to being steady on my feet."   OBJECTIVE:  Vestibular Asssessment   General Observation: NAD    Symptom Behavior:   Subjective history: reports having vertigo every few months, wakes up with it every time. Denies spinning sensation, but just "wakes up with the feeling." Dramamine helps every time. No N/V. Dizziness has caused him to fall in the past   Non-Vestibular symptoms: neck pain   Type of dizziness: Imbalance (Disequilibrium)   Frequency: every few months   Duration: mild case passes in a few hours; moderate/severe case: nearly a full day    Aggravating factors: Induced by position change: lying supine and supine to sit, Worse in the morning, and moving fast   Relieving factors:  medication and letting it run its course   Progression of symptoms: better   Oculomotor Exam:   Ocular Alignment: abnormal and L eye superiorly and laterally deviated with minimal ocular ROM noted in that eye   Ocular ROM:  see above   Spontaneous Nystagmus: absent   Gaze-Induced Nystagmus: absent   Smooth Pursuits: saccades   Saccades: hypometric/undershoots with Lward eye movements   Convergence/Divergence: PT unsure of accuracy and PT did not observe L eye converging    Vestibular-Ocular Reflex (VOR):   Slow VOR: Positive Left   VOR Cancellation: Corrective Saccades   Head-Impulse Test: HIT Right: negative HIT Left: positive   Dynamic Visual Acuity: unable to test accurately due to patient neck stiffness     Positional Testing: not indicated due to patient symptom report    Motion Sensitivity:   Motion Sensitivity Quotient  Intensity: 0 = none, 1 = Lightheaded, 2 = Mild, 3 = Moderate, 4 = Severe, 5 = Vomiting Duration: < 5 s = 0, 5-10s = 1,11-30s = 2, >30s = 3 Score = Intensity + duration     Intensity Duration Score  1. Sitting to supine 0    2. Supine to L side 0    3. Supine to R side 1    4. Supine to sitting 0    5. L Hallpike-Dix     6. Up from L      7. R Hallpike-Dix     8. Up from R      9. Sitting, head  tipped to L knee 0 reports that sometimes will cause dizziness    10. Head up from L  knee 0    11. Sitting, head  tipped to R knee 0 reports that sometimes will cause dizziness    12. Head up from R  knee 0    13. Sitting head turns x5 0    14.Sitting head nods x5 1    15. In stance, 180  turn to L  0    16. In stance, 180  turn to R 0  MSQ = Total score  (# of positions) / 20.48 MSQ = __________________  0-10 mild; 11-30 moderate; 31-100 severe  M-CTSIB:  Condition 1- 30s normal sway Condition 2- 30s mild sway  Condition 3- 30s mild sway  Condition 4- 1.87s   TODAY'S TREATMENT:                                                                                                                               -vestib eval   PATIENT EDUCATION: Education details: exam findings, initial vestib HEP Person educated: Patient Education method: Explanation and Verbal cues Education comprehension: verbalized understanding and needs further education  HOME EXERCISE PROGRAM: Bending / Picking Up Objects    Sitting, slowly bend head down and pick up object on the floor. Return to upright position. Hold position until symptoms subside. Repeat __8__ times per session. Do __3__ sessions per day. Balance: Eyes Open - Bilateral (Varied Surfaces)    Stand, feet shoulder width, eyes open. Maintain balance _30___ seconds. Repeat ___3_ times per set. Do ___3_ sets per session. Do ___5_ sessions per week. Repeat on compliant surface: pillow(s). Repeat with eyes closed.   **must do in a corner with a chair in front of you in case you lose your balance Head Motion: Up and Down    Sitting, slowly move head up with eyes open. Hold position until symptoms subside. Then, move head in opposite direction. Hold position until symptoms subside. Repeat __10__ times per session. Do __3__ sessions per day. GOALS: Goals reviewed with patient? Yes  SHORT TERM GOALS: Target date: 02/17/23  Pt will be IND with initial HEP in order to indicate improved functional mobility and dec fall risk. Baseline: Goal status: INITIAL  2.  Pt will improve FGA to >/=23/30 in order to indicate dec fall risk.   Baseline: 20/30 Goal status: INITIAL  3.  Pt will maintain gait speed of at least 3.0 ft/sec without evidence of compensation/LE ER, less fatigue in order to indicate improved balance/gait.  Baseline:  Goal status: INITIAL  4.  Will assess SOT and write updated STG/LTG to reflect progress.  Baseline:  Goal status: INITIAL  5. Patient will improve condition 4 of M-CTSIB to >/= 15s to demonstrate improved balance Baseline: 1.87s  Goal status:  INITIAL  6.  Pt will negotiate up/down 4 steps with single rail, up/down ramp and curb and ambulate x 500' outdoors over unlevel paved surfaces and grassy surfaces at S level in order to indicate improved community mobility.   Baseline:  Goal status: INITIAL  LONG TERM GOALS: Target date: 03/20/23  Pt will be IND with HEP in order to indicate improved functional mobility and dec fall risk. Baseline:  Goal status: INITIAL  2.  Pt will improve FGA to >/=26/30 in order to indicate dec fall risk.   Baseline: 20/30 Goal status: INITIAL  3.  Will add SOT goal once assessed.  Baseline:  Goal status: INITIAL  4.  Pt will negotiate up/down 12 steps with single rail, up/down ramp and curb and ambulate x 1000' outdoors over varying surfaces (paved and grassy) at mod I level in order to indicate improved community mobility.   Baseline:  Goal status: INITIAL    ASSESSMENT:  CLINICAL IMPRESSION: Patient seen for skilled PT session with emphasis on vestibular assessment. 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 Patient with impaired balance on compliant surfaces with EC. Provided patient with initial vestibular HEP to be expanded upon PRN. Continue POC.   OBJECTIVE IMPAIRMENTS: Abnormal gait, decreased balance, decreased mobility, difficulty walking, decreased strength, dizziness, impaired perceived functional ability, impaired sensation, and postural dysfunction.   ACTIVITY LIMITATIONS: stairs, transfers, and locomotion level  PARTICIPATION LIMITATIONS: cleaning, community activity, occupation, and yard work  PERSONAL FACTORS: Age, Time since onset of injury/illness/exacerbation, and 3+ comorbidities: see above  are also affecting patient's functional outcome.   REHAB POTENTIAL: Excellent  CLINICAL DECISION MAKING: Evolving/moderate complexity  EVALUATION  COMPLEXITY: Moderate  PLAN:  PT FREQUENCY: 1-2x/week  PT DURATION: 8 weeks  PLANNED INTERVENTIONS: Therapeutic exercises, Therapeutic activity, Neuromuscular re-education, Balance training, Gait training, Patient/Family education, Self Care, Vestibular training, and Canalith repositioning  PLAN FOR NEXT SESSION: SOT- unable to do 2/16 due to time constraints.  Add RLE strength and continue to add high level balance with turns as he works at the [employer] in high level job.    Debbora Dus, PT, DPT, CBIS 01/27/23, 2:01 PM

## 2023-02-01 ENCOUNTER — Ambulatory Visit: Payer: Medicare HMO | Admitting: Rehabilitation

## 2023-02-01 ENCOUNTER — Encounter: Payer: Self-pay | Admitting: Rehabilitation

## 2023-02-01 DIAGNOSIS — R42 Dizziness and giddiness: Secondary | ICD-10-CM | POA: Diagnosis not present

## 2023-02-01 DIAGNOSIS — R296 Repeated falls: Secondary | ICD-10-CM

## 2023-02-01 DIAGNOSIS — R2681 Unsteadiness on feet: Secondary | ICD-10-CM | POA: Diagnosis not present

## 2023-02-01 DIAGNOSIS — M79605 Pain in left leg: Secondary | ICD-10-CM | POA: Diagnosis not present

## 2023-02-01 DIAGNOSIS — M79604 Pain in right leg: Secondary | ICD-10-CM | POA: Diagnosis not present

## 2023-02-01 DIAGNOSIS — M6281 Muscle weakness (generalized): Secondary | ICD-10-CM | POA: Diagnosis not present

## 2023-02-01 NOTE — Patient Instructions (Signed)
Feet Apart (Compliant Surface) Head Motion - Eyes Open    With eyes open, standing on compliant surface: w/c cushion, feet shoulder width apart, move head slowly: up and down and then do side to side.  Do only 45 deg head motion, slow but continuous.  Repeat 10 times per session. Do __1-2__ sessions per day.  Copyright  VHI. All rights reserved.   Feet Together, Head Motion - Eyes Open    With eyes open, feet together, move head slowly: up and down and then side to side (only 45 deg if needed). Repeat _10___ times per session. Do __1-2__ sessions per day.  Copyright  VHI. All rights reserved.

## 2023-02-01 NOTE — Therapy (Signed)
OUTPATIENT PHYSICAL THERAPY NEURO EVALUATION   Patient Name: Douglas Taylor MRN: [id number] DOB:05-10-1946, 77 y.o., male Today's Date: 02/01/2023   PCP: Shon Baton, MD REFERRING PROVIDER: Metta Clines, DO  END OF SESSION:  PT End of Session - 02/01/23 1015     Visit Number 3    Number of Visits 9    Date for PT Re-Evaluation 03/20/23    Authorization Type [employer] (10th visit PN needed)    Progress Note Due on Visit 10    PT Start Time 1015    PT Stop Time 1100    PT Time Calculation (min) 45 min    Activity Tolerance Patient tolerated treatment well    Behavior During Therapy WFL for tasks assessed/performed             Past Medical History:  Diagnosis Date   Anxiety    GERD (gastroesophageal reflux disease)    History of gastric ulcer    2004 APPROX.   Seasonal allergies    Urethral stricture    W/ RETENTION   Wears glasses    Past Surgical History:  Procedure Laterality Date   CYSTOSCOPY WITH RETROGRADE URETHROGRAM N/A 05/07/2015   Procedure: CYSTOSCOPY WITH RETROGRADE URETHROGRAM;  Surgeon: Alexis Frock, MD;  Location: [facility];  Service: Urology;  Laterality: N/A;   CYSTOSCOPY WITH URETHRAL DILATATION N/A 05/07/2015   Procedure: CYSTOSCOPY WITH  BALLOON URETHRAL DILATATION;  Surgeon: Alexis Frock, MD;  Location: [facility];  Service: Urology;  Laterality: N/A;   INGUINAL HERNIA REPAIR Right 04/10/2015   Procedure: LAPAROSCOPIC INGUINAL HERNIA REPAIR WITH MESH;  Surgeon: Ralene Ok, MD;  Location: [facility];  Service: General;  Laterality: Right;   INSERTION OF MESH Right 04/10/2015   Procedure: INSERTION OF MESH;  Surgeon: Ralene Ok, MD;  Location: [facility];  Service: General;  Laterality: Right;   KNEE ARTHROSCOPY Left    There are no problems to display for this patient.   ONSET DATE: 01/17/2023 (referral date)  REFERRING DIAG:  Diagnosis  R26.81 (ICD-10-CM) - Unsteady gait  M79.604,M79.605  (ICD-10-CM) - Bilateral leg pain    THERAPY DIAG:  Unsteadiness on feet  Muscle weakness (generalized)  Repeated falls  Dizziness and giddiness  Rationale for Evaluation and Treatment: Rehabilitation  SUBJECTIVE:                                                                                                                                                                                             SUBJECTIVE STATEMENT: Patient reports doing well. Reports exercises are going well, feels like they are helping  Pt accompanied  by: self  PERTINENT HISTORY:  MRI of the cervical spine from 02/09/18 revealed advanced degenerative disc and facet disease but also without cord compression. Underwent labs for neuropathy.   Labs from September  2022 include CK 136, B12 >1550, and TSH 2.16.  NCV-EMG of lower extremities on 09/14/2021 was normal.  MRI of lumbar spine with and without contrast on 10/13/2021 howed degenerative spine changes similar to 2018 but with discogenic marrow edema at T11-12, L2-3 and L5-S1.  PAIN:  Are you having pain? No  PRECAUTIONS: Fall  PATIENT GOALS: "To get back to being steady on my feet."   OBJECTIVE:  Vestibular Asssessment   General Observation: NAD    Symptom Behavior:   Subjective history: reports having vertigo every few months, wakes up with it every time. Denies spinning sensation, but just "wakes up with the feeling." Dramamine helps every time. No N/V. Dizziness has caused him to fall in the past   Non-Vestibular symptoms: neck pain   Type of dizziness: Imbalance (Disequilibrium)   Frequency: every few months   Duration: mild case passes in a few hours; moderate/severe case: nearly a full day    Aggravating factors: Induced by position change: lying supine and supine to sit, Worse in the morning, and moving fast   Relieving factors: medication and letting it run its course   Progression of symptoms: better   Oculomotor Exam:   Ocular Alignment:  abnormal and L eye superiorly and laterally deviated with minimal ocular ROM noted in that eye   Ocular ROM:  see above   Spontaneous Nystagmus: absent   Gaze-Induced Nystagmus: absent   Smooth Pursuits: saccades   Saccades: hypometric/undershoots with Lward eye movements   Convergence/Divergence: PT unsure of accuracy and PT did not observe L eye converging    Vestibular-Ocular Reflex (VOR):   Slow VOR: Positive Left   VOR Cancellation: Corrective Saccades   Head-Impulse Test: HIT Right: negative HIT Left: positive   Dynamic Visual Acuity: unable to test accurately due to patient neck stiffness     Positional Testing: not indicated due to patient symptom report    Motion Sensitivity:   Motion Sensitivity Quotient  Intensity: 0 = none, 1 = Lightheaded, 2 = Mild, 3 = Moderate, 4 = Severe, 5 = Vomiting Duration: < 5 s = 0, 5-10s = 1,11-30s = 2, >30s = 3 Score = Intensity + duration     Intensity Duration Score  1. Sitting to supine 0    2. Supine to L side 0    3. Supine to R side 1    4. Supine to sitting 0    5. L Hallpike-Dix     6. Up from L      7. R Hallpike-Dix     8. Up from R      9. Sitting, head  tipped to L knee 0 reports that sometimes will cause dizziness    10. Head up from L  knee 0    11. Sitting, head  tipped to R knee 0 reports that sometimes will cause dizziness    12. Head up from R  knee 0    13. Sitting head turns x5 0    14.Sitting head nods x5 1    15. In stance, 180  turn to L  0    16. In stance, 180  turn to R 0     MSQ = Total score  (# of positions) / 20.48 MSQ = __________________  0-10 mild;  11-30 moderate; 31-100 severe  M-CTSIB:  Condition 1- 30s normal sway Condition 2- 30s mild sway  Condition 3- 30s mild sway  Condition 4- 1.87s   TODAY'S TREATMENT:                                                                                                                              SOT:  Condition 1: All above  normal Condition 2: All above normal Condition 3: 1 below, 2 above normal Condition 4: All above normal Condition 5: 3 falls Condition 6: 2 falls, 1 below normal  Composite score: 50, Normal is approx 70  Has zero vestibular input and relies on ankle strategy when hip needed.    Added to HEP, see below  Feet Apart (Compliant Surface) Head Motion - Eyes Open    With eyes open, standing on compliant surface: w/c cushion, feet shoulder width apart, move head slowly: up and down and then do side to side.  Do only 45 deg head motion, slow but continuous.  Repeat 10 times per session. Do __1-2__ sessions per day.  Copyright  VHI. All rights reserved.    Feet Together, Head Motion - Eyes Open    With eyes open, feet together, move head slowly: up and down and then side to side (only 45 deg if needed). Repeat _10___ times per session. Do __1-2__ sessions per day.  Copyright  VHI. All rights reserved.   PATIENT EDUCATION: Education details: SOT findings, additions to HEP  Person educated: Patient Education method: Explanation and Verbal cues Education comprehension: verbalized understanding and needs further education  HOME EXERCISE PROGRAM: Bending / Picking Up Objects    Sitting, slowly bend head down and pick up object on the floor. Return to upright position. Hold position until symptoms subside. Repeat __8__ times per session. Do __3__ sessions per day. Balance: Eyes Open - Bilateral (Varied Surfaces)    Stand, feet shoulder width, eyes open. Maintain balance _30___ seconds. Repeat ___3_ times per set. Do ___3_ sets per session. Do ___5_ sessions per week. Repeat on compliant surface: pillow(s). Repeat with eyes closed.   **must do in a corner with a chair in front of you in case you lose your balance Head Motion: Up and Down    Sitting, slowly move head up with eyes open. Hold position until symptoms subside. Then, move head in opposite direction. Hold position  until symptoms subside. Repeat __10__ times per session. Do __3__ sessions per day. GOALS: Goals reviewed with patient? Yes  SHORT TERM GOALS: Target date: 02/17/23  Pt will be IND with initial HEP in order to indicate improved functional mobility and dec fall risk. Baseline: Goal status: INITIAL  2.  Pt will improve FGA to >/=23/30 in order to indicate dec fall risk.   Baseline: 20/30 Goal status: INITIAL  3.  Pt will maintain gait speed of at least 3.0 ft/sec without evidence of compensation/LE ER, less fatigue in order to indicate  improved balance/gait.  Baseline:  Goal status: INITIAL  4.  Will assess SOT and write updated STG/LTG to reflect progress.  Baseline:  Goal status: INITIAL  5. Patient will improve condition 4 of M-CTSIB to >/= 15s to demonstrate improved balance Baseline: 1.87s  Goal status: INITIAL  6.  Pt will negotiate up/down 4 steps with single rail, up/down ramp and curb and ambulate x 500' outdoors over unlevel paved surfaces and grassy surfaces at S level in order to indicate improved community mobility.   Baseline:  Goal status: INITIAL  LONG TERM GOALS: Target date: 03/20/23  Pt will be IND with HEP in order to indicate improved functional mobility and dec fall risk. Baseline:  Goal status: INITIAL  2.  Pt will improve FGA to >/=26/30 in order to indicate dec fall risk.   Baseline: 20/30 Goal status: INITIAL  3.  Will add SOT goal once assessed.  Baseline:  Goal status: INITIAL  4.  Pt will negotiate up/down 12 steps with single rail, up/down ramp and curb and ambulate x 1000' outdoors over varying surfaces (paved and grassy) at mod I level in order to indicate improved community mobility.   Baseline:  Goal status: INITIAL    ASSESSMENT:  CLINICAL IMPRESSION: Skilled session focused on formal SOT assessment.  He scored 77 which is well below normal for his age (approx 20).  Note most difficulty with vestibular related conditions (5,6)  with falls throughout both conditions all but one trial.  Added 2 more corner balance exercises to address vestibular deficits.  Tolerated well.   OBJECTIVE IMPAIRMENTS: Abnormal gait, decreased balance, decreased mobility, difficulty walking, decreased strength, dizziness, impaired perceived functional ability, impaired sensation, and postural dysfunction.   ACTIVITY LIMITATIONS: stairs, transfers, and locomotion level  PARTICIPATION LIMITATIONS: cleaning, community activity, occupation, and yard work  PERSONAL FACTORS: Age, Time since onset of injury/illness/exacerbation, and 3+ comorbidities: see above  are also affecting patient's functional outcome.   REHAB POTENTIAL: Excellent  CLINICAL DECISION MAKING: Evolving/moderate complexity  EVALUATION COMPLEXITY: Moderate  PLAN:  PT FREQUENCY: 1-2x/week  PT DURATION: 8 weeks  PLANNED INTERVENTIONS: Therapeutic exercises, Therapeutic activity, Neuromuscular re-education, Balance training, Gait training, Patient/Family education, Self Care, Vestibular training, and Canalith repositioning  PLAN FOR NEXT SESSION: Continue vestibular challenges (EC, compliant), work on hip and stepping strategy.  Add RLE strength and continue to add high level balance with turns as he works at the [employer] in high level job.    Cameron Sprang, PT, MPT [facility] [address] [place], [place], [address] Phone: [phone]   Fax:  [phone] 02/01/23, 12:00 PM

## 2023-02-09 ENCOUNTER — Ambulatory Visit: Payer: Medicare HMO

## 2023-02-09 DIAGNOSIS — M6281 Muscle weakness (generalized): Secondary | ICD-10-CM | POA: Diagnosis not present

## 2023-02-09 DIAGNOSIS — K219 Gastro-esophageal reflux disease without esophagitis: Secondary | ICD-10-CM | POA: Diagnosis not present

## 2023-02-09 DIAGNOSIS — Z86010 Personal history of colonic polyps: Secondary | ICD-10-CM | POA: Diagnosis not present

## 2023-02-09 DIAGNOSIS — R42 Dizziness and giddiness: Secondary | ICD-10-CM

## 2023-02-09 DIAGNOSIS — R2681 Unsteadiness on feet: Secondary | ICD-10-CM

## 2023-02-09 DIAGNOSIS — K200 Eosinophilic esophagitis: Secondary | ICD-10-CM | POA: Diagnosis not present

## 2023-02-09 DIAGNOSIS — R296 Repeated falls: Secondary | ICD-10-CM | POA: Diagnosis not present

## 2023-02-09 DIAGNOSIS — M79604 Pain in right leg: Secondary | ICD-10-CM | POA: Diagnosis not present

## 2023-02-09 DIAGNOSIS — M79605 Pain in left leg: Secondary | ICD-10-CM | POA: Diagnosis not present

## 2023-02-09 NOTE — Therapy (Signed)
OUTPATIENT PHYSICAL THERAPY NEURO TREATMENT   Patient Name: Douglas Taylor MRN: [id number] DOB:24-Dec-1945, 77 y.o., male Today's Date: 02/09/2023   PCP: Shon Baton, MD REFERRING PROVIDER: Metta Clines, DO  END OF SESSION:  PT End of Session - 02/09/23 1102     Visit Number 4    Number of Visits 9    Date for PT Re-Evaluation 03/20/23    Authorization Type [employer] (10th visit PN needed)    Progress Note Due on Visit 10    PT Start Time 1100    PT Stop Time 1144    PT Time Calculation (min) 44 min    Equipment Utilized During Treatment Gait belt    Activity Tolerance Patient tolerated treatment well    Behavior During Therapy WFL for tasks assessed/performed             Past Medical History:  Diagnosis Date   Anxiety    GERD (gastroesophageal reflux disease)    History of gastric ulcer    2004 APPROX.   Seasonal allergies    Urethral stricture    W/ RETENTION   Wears glasses    Past Surgical History:  Procedure Laterality Date   CYSTOSCOPY WITH RETROGRADE URETHROGRAM N/A 05/07/2015   Procedure: CYSTOSCOPY WITH RETROGRADE URETHROGRAM;  Surgeon: Alexis Frock, MD;  Location: [facility];  Service: Urology;  Laterality: N/A;   CYSTOSCOPY WITH URETHRAL DILATATION N/A 05/07/2015   Procedure: CYSTOSCOPY WITH  BALLOON URETHRAL DILATATION;  Surgeon: Alexis Frock, MD;  Location: [facility];  Service: Urology;  Laterality: N/A;   INGUINAL HERNIA REPAIR Right 04/10/2015   Procedure: LAPAROSCOPIC INGUINAL HERNIA REPAIR WITH MESH;  Surgeon: Ralene Ok, MD;  Location: [facility];  Service: General;  Laterality: Right;   INSERTION OF MESH Right 04/10/2015   Procedure: INSERTION OF MESH;  Surgeon: Ralene Ok, MD;  Location: [facility];  Service: General;  Laterality: Right;   KNEE ARTHROSCOPY Left    There are no problems to display for this patient.   ONSET DATE: 01/17/2023 (referral date)  REFERRING DIAG:  Diagnosis  R26.81  (ICD-10-CM) - Unsteady gait  M79.604,M79.605 (ICD-10-CM) - Bilateral leg pain    THERAPY DIAG:  Unsteadiness on feet  Muscle weakness (generalized)  Dizziness and giddiness  Rationale for Evaluation and Treatment: Rehabilitation  SUBJECTIVE:                                                                                                                                                                                             SUBJECTIVE STATEMENT: Patient reports doing well. Doing more stairs and reports they're getting  easier. Denies falls/near falls. Exercises going well. Corner exercises remain challenging.  Pt accompanied by: self  PERTINENT HISTORY:  MRI of the cervical spine from 02/09/18 revealed advanced degenerative disc and facet disease but also without cord compression. Underwent labs for neuropathy.   Labs from September  2022 include CK 136, B12 >1550, and TSH 2.16.  NCV-EMG of lower extremities on 09/14/2021 was normal.  MRI of lumbar spine with and without contrast on 10/13/2021 howed degenerative spine changes similar to 2018 but with discogenic marrow edema at T11-12, L2-3 and L5-S1.  PAIN:  Are you having pain? No  PRECAUTIONS: Fall  PATIENT GOALS: "To get back to being steady on my feet."   TODAY'S TREATMENT:                                                                                                                             -figure 8 turns on solid ground -> on/off compliant ground  -limits of stability on neurocom   -decreased posterior excursion, decreased L reaction time, decreased control of excursion posteriorly   -appropriate stepping response  -ambulating on treadmill at 0.27mh with horizontal head turns and vertical head turns to read playing cards   PATIENT EDUCATION: Education details: adjustment to HEP to add headturns when standing on compliant surface Person educated: Patient Education method: Explanation and Verbal cues Education  comprehension: verbalized understanding and needs further education  HOME EXERCISE PROGRAM: Balance: Eyes Open - Bilateral (Varied Surfaces)    Stand, feet shoulder width, eyes open. Maintain balance _30___ seconds. Repeat ___3_ times per set. Do ___3_ sets per session. Do ___5_ sessions per week. Repeat on compliant surface: pillow(s). Repeat with eyes closed.   **must do in a corner with a chair in front of you in case you lose your balance Head Motion: Up and Down    Sitting, slowly move head up with eyes open. Hold position until symptoms subside. Then, move head in opposite direction. Hold position until symptoms subside. Repeat __10__ times per session. Do __3__ sessions per day. GOALS: Goals reviewed with patient? Yes  SHORT TERM GOALS: Target date: 02/17/23  Pt will be IND with initial HEP in order to indicate improved functional mobility and dec fall risk. Baseline: Goal status: INITIAL  2.  Pt will improve FGA to >/=23/30 in order to indicate dec fall risk.   Baseline: 20/30 Goal status: INITIAL  3.  Pt will maintain gait speed of at least 3.0 ft/sec without evidence of compensation/LE ER, less fatigue in order to indicate improved balance/gait.  Baseline:  Goal status: INITIAL  4.  Will assess SOT and write updated STG/LTG to reflect progress.  Baseline:  Goal status: INITIAL  5. Patient will improve condition 4 of M-CTSIB to >/= 15s to demonstrate improved balance Baseline: 1.87s  Goal status: INITIAL  6.  Pt will negotiate up/down 4 steps with single rail, up/down ramp and curb and ambulate x 500' outdoors over [place]  surfaces and grassy surfaces at S level in order to indicate improved community mobility.   Baseline:  Goal status: INITIAL  LONG TERM GOALS: Target date: 03/20/23  Pt will be IND with HEP in order to indicate improved functional mobility and dec fall risk. Baseline:  Goal status: INITIAL  2.  Pt will improve FGA to >/=26/30 in  order to indicate dec fall risk.   Baseline: 20/30 Goal status: INITIAL  3.  Will add SOT goal once assessed.  Baseline:  Goal status: INITIAL  4.  Pt will negotiate up/down 12 steps with single rail, up/down ramp and curb and ambulate x 1000' outdoors over varying surfaces (paved and grassy) at mod I level in order to indicate improved community mobility.   Baseline:  Goal status: INITIAL    ASSESSMENT:  CLINICAL IMPRESSION: Patient seen for skilled PT session with emphasis on balance retraining. Able to achieve tight turns (necessary for his work0 well, even on compliant surface. Does have slight trendelenburg, which further impacts his balance, but this is likely due to L LE weakness from an injury ~1 year ago. Impaired limits of stability posteriorly and to the L. Discussed implications of that with patient. Continue POC.   OBJECTIVE IMPAIRMENTS: Abnormal gait, decreased balance, decreased mobility, difficulty walking, decreased strength, dizziness, impaired perceived functional ability, impaired sensation, and postural dysfunction.   ACTIVITY LIMITATIONS: stairs, transfers, and locomotion level  PARTICIPATION LIMITATIONS: cleaning, community activity, occupation, and yard work  PERSONAL FACTORS: Age, Time since onset of injury/illness/exacerbation, and 3+ comorbidities: see above  are also affecting patient's functional outcome.   REHAB POTENTIAL: Excellent  CLINICAL DECISION MAKING: Evolving/moderate complexity  EVALUATION COMPLEXITY: Moderate  PLAN:  PT FREQUENCY: 1-2x/week  PT DURATION: 8 weeks  PLANNED INTERVENTIONS: Therapeutic exercises, Therapeutic activity, Neuromuscular re-education, Balance training, Gait training, Patient/Family education, Self Care, Vestibular training, and Canalith repositioning  PLAN FOR NEXT SESSION: Continue vestibular challenges (EC, compliant), work on hip and stepping strategy.  Add RLE strength and continue to add high level balance  with turns as he works at the [employer] in high level job.    Debbora Dus, PT, DPT, CBIS 02/09/23, 11:53 AM

## 2023-02-13 ENCOUNTER — Ambulatory Visit: Payer: Medicare HMO | Attending: Neurology

## 2023-02-13 DIAGNOSIS — R296 Repeated falls: Secondary | ICD-10-CM | POA: Insufficient documentation

## 2023-02-13 DIAGNOSIS — R2681 Unsteadiness on feet: Secondary | ICD-10-CM | POA: Insufficient documentation

## 2023-02-13 DIAGNOSIS — M6281 Muscle weakness (generalized): Secondary | ICD-10-CM | POA: Insufficient documentation

## 2023-02-13 DIAGNOSIS — R42 Dizziness and giddiness: Secondary | ICD-10-CM | POA: Insufficient documentation

## 2023-02-13 NOTE — Therapy (Signed)
OUTPATIENT PHYSICAL THERAPY NEURO TREATMENT   Patient Name: Douglas Taylor MRN: [id number] DOB:[birth year], 77 y.o., male Today's Date: 02/13/2023   PCP: Shon Baton, MD REFERRING PROVIDER: Metta Clines, DO  END OF SESSION:  PT End of Session - 02/13/23 1102     Visit Number 5    Number of Visits 9    Date for PT Re-Evaluation 03/20/23    Authorization Type [employer] (10th visit PN needed)    Progress Note Due on Visit 10    PT Start Time 1100    PT Stop Time 1144    PT Time Calculation (min) 44 min    Equipment Utilized During Treatment Gait belt    Activity Tolerance Patient tolerated treatment well    Behavior During Therapy WFL for tasks assessed/performed             Past Medical History:  Diagnosis Date   Anxiety    GERD (gastroesophageal reflux disease)    History of gastric ulcer    2004 APPROX.   Seasonal allergies    Urethral stricture    W/ RETENTION   Wears glasses    Past Surgical History:  Procedure Laterality Date   CYSTOSCOPY WITH RETROGRADE URETHROGRAM N/A 05/07/2015   Procedure: CYSTOSCOPY WITH RETROGRADE URETHROGRAM;  Surgeon: Alexis Frock, MD;  Location: [facility];  Service: Urology;  Laterality: N/A;   CYSTOSCOPY WITH URETHRAL DILATATION N/A 05/07/2015   Procedure: CYSTOSCOPY WITH  BALLOON URETHRAL DILATATION;  Surgeon: Alexis Frock, MD;  Location: [facility];  Service: Urology;  Laterality: N/A;   INGUINAL HERNIA REPAIR Right 04/10/2015   Procedure: LAPAROSCOPIC INGUINAL HERNIA REPAIR WITH MESH;  Surgeon: Ralene Ok, MD;  Location: [facility];  Service: General;  Laterality: Right;   INSERTION OF MESH Right 04/10/2015   Procedure: INSERTION OF MESH;  Surgeon: Ralene Ok, MD;  Location: [facility];  Service: General;  Laterality: Right;   KNEE ARTHROSCOPY Left    There are no problems to display for this patient.   ONSET DATE: 01/17/2023 (referral date)  REFERRING DIAG:  Diagnosis  R26.81  (ICD-10-CM) - Unsteady gait  M79.604,M79.605 (ICD-10-CM) - Bilateral leg pain    THERAPY DIAG:  Unsteadiness on feet  Muscle weakness (generalized)  Dizziness and giddiness  Repeated falls  Rationale for Evaluation and Treatment: Rehabilitation  SUBJECTIVE:                                                                                                                                                                                             SUBJECTIVE STATEMENT: Patient reports doing well. Did have a fall  at work over the weekend. L foot caught the ground (walking inside) and fell. Denies hitting head and able to get off the floor himself. Corner exercises improving.  Pt accompanied by: self  PERTINENT HISTORY:  MRI of the cervical spine from 02/09/18 revealed advanced degenerative disc and facet disease but also without cord compression. Underwent labs for neuropathy.   Labs from September  2022 include CK 136, B12 >1550, and TSH 2.16.  NCV-EMG of lower extremities on 09/14/2021 was normal.  MRI of lumbar spine with and without contrast on 10/13/2021 howed degenerative spine changes similar to 2018 but with discogenic marrow edema at T11-12, L2-3 and L5-S1.  PAIN:  Are you having pain? No  PRECAUTIONS: Fall  PATIENT GOALS: "To get back to being steady on my feet."   TODAY'S TREATMENT:                                                                                                                             Theract:  -goal assessment  -SOT   -pass 3/3   -pass 3/3   -pass 0/3   -pass 1/3   -pass 0/3 with 2 falls    -pass 1/3 with 2 falls   -composite: 48   -vestibular analysis: ~20  [facility] PT Assessment - 02/13/23 0001       Standardized Balance Assessment   Standardized Balance Assessment 10 meter walk test    10 Meter Walk .46ms      Functional Gait  Assessment   Gait assessed  Yes    Gait Level Surface Walks 20 ft in less than 7 sec but greater than 5.5 sec, uses  assistive device, slower speed, mild gait deviations, or deviates 6-10 in outside of the 12 in walkway width.    Change in Gait Speed Able to change speed, demonstrates mild gait deviations, deviates 6-10 in outside of the 12 in walkway width, or no gait deviations, unable to achieve a major change in velocity, or uses a change in velocity, or uses an assistive device.    Gait with Horizontal Head Turns Performs head turns smoothly with slight change in gait velocity (eg, minor disruption to smooth gait path), deviates 6-10 in outside 12 in walkway width, or uses an assistive device.    Gait with Vertical Head Turns Performs task with slight change in gait velocity (eg, minor disruption to smooth gait path), deviates 6 - 10 in outside 12 in walkway width or uses assistive device    Gait and Pivot Turn Pivot turns safely within 3 sec and stops quickly with no loss of balance.    Step Over Obstacle Is able to step over 2 stacked shoe boxes taped together (9 in total height) without changing gait speed. No evidence of imbalance.    Gait with Narrow Base of Support Ambulates 7-9 steps.    Gait with Eyes Closed Walks 20 ft, uses assistive device, slower speed, mild gait deviations, deviates 6-10  in outside 12 in walkway width. Ambulates 20 ft in less than 9 sec but greater than 7 sec.    Ambulating Backwards Walks 20 ft, uses assistive device, slower speed, mild gait deviations, deviates 6-10 in outside 12 in walkway width.    Steps Alternating feet, must use rail.    Total Score 22            M-CTSIB:  Condition 4: 7s with LOB backwards PATIENT EDUCATION: Education details: continue HEP Person educated: Patient Education method: Explanation and Verbal cues Education comprehension: verbalized understanding and needs further education  HOME EXERCISE PROGRAM: Balance: Eyes Open - Bilateral (Varied Surfaces)    Stand, feet shoulder width, eyes open. Maintain balance _30___ seconds. Repeat  ___3_ times per set. Do ___3_ sets per session. Do ___5_ sessions per week. Repeat on compliant surface: pillow(s). Repeat with eyes closed.   **must do in a corner with a chair in front of you in case you lose your balance Head Motion: Up and Down    Sitting, slowly move head up with eyes open. Hold position until symptoms subside. Then, move head in opposite direction. Hold position until symptoms subside. Repeat __10__ times per session. Do __3__ sessions per day. GOALS: Goals reviewed with patient? Yes  SHORT TERM GOALS: Target date: 02/17/23  Pt will be IND with initial HEP in order to indicate improved functional mobility and dec fall risk. Baseline: provided Goal status: MET  2.  Pt will improve FGA to >/=23/30 in order to indicate dec fall risk.   Baseline: 20/30; 22/30 Goal status: NOT MET  3.  Pt will maintain gait speed of at least 3.0 ft/sec without evidence of compensation/LE ER, less fatigue in order to indicate improved balance/gait.  Baseline: 2.65f/s or .87m Goal status: NOT MET  4.  Patient will improve SOT score to >/=60 to demonstrate improved balance Baseline: 50; 48 Goal status: NOT MET  5. Patient will improve condition 4 of M-CTSIB to >/= 15s to demonstrate improved balance Baseline: 1.87s; 7s Goal status: NOT MET  6.  Pt will negotiate up/down 4 steps with single rail, up/down ramp and curb and ambulate x 500' outdoors over unlevel paved surfaces and grassy surfaces at S level in order to indicate improved community mobility.   Baseline: not assessed and therefore not tested Goal status: Deferred   LONG TERM GOALS: Target date: 03/20/23  Pt will be IND with HEP in order to indicate improved functional mobility and dec fall risk. Baseline:  Goal status: INITIAL  2.  Pt will improve FGA to >/=26/30 in order to indicate dec fall risk.   Baseline: 20/30 Goal status: INITIAL  3.  Patient will improve SOT composite score to>/= 70 to demonstrate  improved balance Baseline: 50 Goal status: INITIAL  4.  Pt will negotiate up/down 12 steps with single rail, up/down ramp and curb and ambulate x 1000' outdoors over varying surfaces (paved and grassy) at mod I level in order to indicate improved community mobility.   Baseline:  Goal status: INITIAL    ASSESSMENT:  CLINICAL IMPRESSION: Patient seen for skilled PT session with emphasis on goal assessment. He met 1/5 STG, but did make progress toward remaining goals. He reports being very tired from this past weekend with a lot going on at work and that likely impacted test results. 10 Meter Walk Test: Patient instructed to walk 10 meters (32.8 ft) as quickly and as safely as possible at their normal speed x2 and at a  fast speed x2. Time measured from 2 meter mark to 8 meter mark to accommodate ramp-up and ramp-down.  Normal speed: .15ms Cut off scores: <0.4 m/s = household Ambulator, 0.4-0.8 m/s = limited community Ambulator, >0.8 m/s = community Ambulator, >1.2 m/s = crossing a street, <1.0 = increased fall risk MCID 0.05 m/s (small), 0.13 m/s (moderate), 0.06 m/s (significant)  (ANPTA Core Set of Outcome Measures for Adults with Neurologic Conditions, 2018). Patient demonstrates increased fall risk as noted by score of 22/30 on  Functional Gait Assessment.   <22/30 = predictive of falls, <20/30 = fall in 6 months, <18/30 = predictive of falls in PD MCID: 5 points stroke population, 4 points geriatric population (ANPTA Core Set of Outcome Measures for Adults with Neurologic Conditions, 2018). Patient with a decrease in composite score on SOT, but significant improvement in vestibular score from essentially 0 to ~20. Continue POC.      OBJECTIVE IMPAIRMENTS: Abnormal gait, decreased balance, decreased mobility, difficulty walking, decreased strength, dizziness, impaired perceived functional ability, impaired sensation, and postural dysfunction.   ACTIVITY LIMITATIONS: stairs, transfers,  and locomotion level  PARTICIPATION LIMITATIONS: cleaning, community activity, occupation, and yard work  PERSONAL FACTORS: Age, Time since onset of injury/illness/exacerbation, and 3+ comorbidities: see above  are also affecting patient's functional outcome.   REHAB POTENTIAL: Excellent  CLINICAL DECISION MAKING: Evolving/moderate complexity  EVALUATION COMPLEXITY: Moderate  PLAN:  PT FREQUENCY: 1-2x/week  PT DURATION: 8 weeks  PLANNED INTERVENTIONS: Therapeutic exercises, Therapeutic activity, Neuromuscular re-education, Balance training, Gait training, Patient/Family education, Self Care, Vestibular training, and Canalith repositioning  PLAN FOR NEXT SESSION: Continue vestibular challenges (EC, compliant), work on hip and stepping strategy, posterior LOB recovery/ weight shifts   JDebbora Dus PT, DPT, CBIS 02/13/23, 12:12 PM

## 2023-02-22 ENCOUNTER — Ambulatory Visit: Payer: Medicare HMO

## 2023-02-22 DIAGNOSIS — M6281 Muscle weakness (generalized): Secondary | ICD-10-CM

## 2023-02-22 DIAGNOSIS — R2681 Unsteadiness on feet: Secondary | ICD-10-CM | POA: Diagnosis not present

## 2023-02-22 DIAGNOSIS — R42 Dizziness and giddiness: Secondary | ICD-10-CM

## 2023-02-22 DIAGNOSIS — R296 Repeated falls: Secondary | ICD-10-CM | POA: Diagnosis not present

## 2023-02-22 NOTE — Therapy (Signed)
OUTPATIENT PHYSICAL THERAPY NEURO TREATMENT   Patient Name: Douglas Taylor MRN: [id number] DOB:[birth year], 77 y.o., male Today's Date: 02/22/2023   PCP: Shon Baton, MD REFERRING PROVIDER: Metta Clines, DO  END OF SESSION:  PT End of Session - 02/22/23 0953     Visit Number 6    Number of Visits 9    Date for PT Re-Evaluation 03/20/23    Authorization Type [employer] (10th visit PN needed)    Progress Note Due on Visit 10    PT Start Time 1010    PT Stop Time 1056    PT Time Calculation (min) 46 min    Equipment Utilized During Treatment Gait belt    Activity Tolerance Patient tolerated treatment well    Behavior During Therapy WFL for tasks assessed/performed             Past Medical History:  Diagnosis Date   Anxiety    GERD (gastroesophageal reflux disease)    History of gastric ulcer    2004 APPROX.   Seasonal allergies    Urethral stricture    W/ RETENTION   Wears glasses    Past Surgical History:  Procedure Laterality Date   CYSTOSCOPY WITH RETROGRADE URETHROGRAM N/A 05/07/2015   Procedure: CYSTOSCOPY WITH RETROGRADE URETHROGRAM;  Surgeon: Alexis Frock, MD;  Location: [facility];  Service: Urology;  Laterality: N/A;   CYSTOSCOPY WITH URETHRAL DILATATION N/A 05/07/2015   Procedure: CYSTOSCOPY WITH  BALLOON URETHRAL DILATATION;  Surgeon: Alexis Frock, MD;  Location: [facility];  Service: Urology;  Laterality: N/A;   INGUINAL HERNIA REPAIR Right 04/10/2015   Procedure: LAPAROSCOPIC INGUINAL HERNIA REPAIR WITH MESH;  Surgeon: Ralene Ok, MD;  Location: [facility];  Service: General;  Laterality: Right;   INSERTION OF MESH Right 04/10/2015   Procedure: INSERTION OF MESH;  Surgeon: Ralene Ok, MD;  Location: [facility];  Service: General;  Laterality: Right;   KNEE ARTHROSCOPY Left    There are no problems to display for this patient.   ONSET DATE: 01/17/2023 (referral date)  REFERRING DIAG:  Diagnosis  R26.81  (ICD-10-CM) - Unsteady gait  M79.604,M79.605 (ICD-10-CM) - Bilateral leg pain    THERAPY DIAG:  Unsteadiness on feet  Muscle weakness (generalized)  Dizziness and giddiness  Repeated falls  Rationale for Evaluation and Treatment: Rehabilitation  SUBJECTIVE:                                                                                                                                                                                             SUBJECTIVE STATEMENT: Patient reports doing well. Denies falls/near falls. Exercises  going well. Does continue to report increased LOB backwards.  Pt accompanied by: self  PERTINENT HISTORY:  MRI of the cervical spine from 02/09/18 revealed advanced degenerative disc and facet disease but also without cord compression. Underwent labs for neuropathy.   Labs from September  2022 include CK 136, B12 >1550, and TSH 2.16.  NCV-EMG of lower extremities on 09/14/2021 was normal.  MRI of lumbar spine with and without contrast on 10/13/2021 howed degenerative spine changes similar to 2018 but with discogenic marrow edema at T11-12, L2-3 and L5-S1.  PAIN:  Are you having pain? No  PRECAUTIONS: Fall  PATIENT GOALS: "To get back to being steady on my feet."   TODAY'S TREATMENT:                                                                                                                             NMR:  -in // bars: standing on rockerboard A/P smooth weight shifts  -VOR x1 horizontal with lateral stepping  -VOR x1 vertical with anterior/posterior stepping  -moving cones x6 from high shelf to ground-> standing on compliant surface doing that  GAIT: -treadmill up to 1.3mh flat road or 1.015m 3% incline   -Mod cues for heel strike, upright posture   -short shuffling steps resume with simple dual task -increased B knee flexion noted through swing and stance, likely due to decreased hamstring flexibility   -seated hamstring stretch B LE 3x30s   PATIENT  EDUCATION: Education details: continue HEP, safely walking on the treadmill, minimizing dual tasking Person educated: Patient Education method: Explanation and Verbal cues Education comprehension: verbalized understanding and needs further education  HOME EXERCISE PROGRAM: Balance: Eyes Open - Bilateral (Varied Surfaces)    Stand, feet shoulder width, eyes open. Maintain balance _30___ seconds. Repeat ___3_ times per set. Do ___3_ sets per session. Do ___5_ sessions per week. Repeat on compliant surface: pillow(s). Repeat with eyes closed.   **must do in a corner with a chair in front of you in case you lose your balance Head Motion: Up and Down    Sitting, slowly move head up with eyes open. Hold position until symptoms subside. Then, move head in opposite direction. Hold position until symptoms subside. Repeat __10__ times per session. Do __3__ sessions per day. GOALS: Goals reviewed with patient? Yes  SHORT TERM GOALS: Target date: 02/17/23  Pt will be IND with initial HEP in order to indicate improved functional mobility and dec fall risk. Baseline: provided Goal status: MET  2.  Pt will improve FGA to >/=23/30 in order to indicate dec fall risk.   Baseline: 20/30; 22/30 Goal status: NOT MET  3.  Pt will maintain gait speed of at least 3.0 ft/sec without evidence of compensation/LE ER, less fatigue in order to indicate improved balance/gait.  Baseline: 2.832f or .75m47moal status: NOT MET  4.  Patient will improve SOT score to >/=60 to demonstrate improved balance Baseline: 50; 48 Goal status: NOT  MET  5. Patient will improve condition 4 of M-CTSIB to >/= 15s to demonstrate improved balance Baseline: 1.87s; 7s Goal status: NOT MET  6.  Pt will negotiate up/down 4 steps with single rail, up/down ramp and curb and ambulate x 500' outdoors over unlevel paved surfaces and grassy surfaces at S level in order to indicate improved community mobility.   Baseline: not  assessed and therefore not tested Goal status: Deferred   LONG TERM GOALS: Target date: 03/20/23  Pt will be IND with HEP in order to indicate improved functional mobility and dec fall risk. Baseline:  Goal status: INITIAL  2.  Pt will improve FGA to >/=26/30 in order to indicate dec fall risk.   Baseline: 20/30 Goal status: INITIAL  3.  Patient will improve SOT composite score to>/= 70 to demonstrate improved balance Baseline: 50 Goal status: INITIAL  4.  Pt will negotiate up/down 12 steps with single rail, up/down ramp and curb and ambulate x 1000' outdoors over varying surfaces (paved and grassy) at mod I level in order to indicate improved community mobility.   Baseline:  Goal status: INITIAL    ASSESSMENT:  CLINICAL IMPRESSION: Patient seen for skilled PT session with emphasis on balance and gait retraining. Does continue to have difficulty with posterior LOB and recovery with little to no balance strategy. When in the // bars for safety, first option is for patient to grab bars to regain balance, but that does not offer functional carryover. Gait with simple dual tasking returns to short shuffling steps and mid foot contact, flexed posture, increased B knee flexion. Discussed implications of dual tasking and increased risk for falling. Continue POC.      OBJECTIVE IMPAIRMENTS: Abnormal gait, decreased balance, decreased mobility, difficulty walking, decreased strength, dizziness, impaired perceived functional ability, impaired sensation, and postural dysfunction.   ACTIVITY LIMITATIONS: stairs, transfers, and locomotion level  PARTICIPATION LIMITATIONS: cleaning, community activity, occupation, and yard work  PERSONAL FACTORS: Age, Time since onset of injury/illness/exacerbation, and 3+ comorbidities: see above  are also affecting patient's functional outcome.   REHAB POTENTIAL: Excellent  CLINICAL DECISION MAKING: Evolving/moderate complexity  EVALUATION COMPLEXITY:  Moderate  PLAN:  PT FREQUENCY: 1-2x/week  PT DURATION: 8 weeks  PLANNED INTERVENTIONS: Therapeutic exercises, Therapeutic activity, Neuromuscular re-education, Balance training, Gait training, Patient/Family education, Self Care, Vestibular training, and Canalith repositioning  PLAN FOR NEXT SESSION: Continue vestibular challenges (EC, compliant), work on hip and stepping strategy, posterior LOB recovery/ weight shifts, resisted gait, blaze pods   Debbora Dus, PT, DPT, CBIS 02/22/23, 11:02 AM

## 2023-03-01 ENCOUNTER — Other Ambulatory Visit: Payer: Self-pay | Admitting: Neurology

## 2023-03-03 ENCOUNTER — Ambulatory Visit: Payer: Medicare HMO

## 2023-03-03 DIAGNOSIS — R42 Dizziness and giddiness: Secondary | ICD-10-CM

## 2023-03-03 DIAGNOSIS — R2681 Unsteadiness on feet: Secondary | ICD-10-CM | POA: Diagnosis not present

## 2023-03-03 DIAGNOSIS — R296 Repeated falls: Secondary | ICD-10-CM

## 2023-03-03 DIAGNOSIS — M6281 Muscle weakness (generalized): Secondary | ICD-10-CM

## 2023-03-03 NOTE — Therapy (Signed)
OUTPATIENT PHYSICAL THERAPY NEURO TREATMENT   Patient Name: Douglas Taylor MRN: [id number] DOB:08-15-1946, 77 y.o., male Today's Date: 03/03/2023   PCP: Shon Baton, MD REFERRING PROVIDER: Metta Clines, DO  END OF SESSION:  PT End of Session - 03/03/23 1107     Visit Number 7    Number of Visits 9    Date for PT Re-Evaluation 03/20/23    Authorization Type [employer] (10th visit PN needed)    Progress Note Due on Visit 10    PT Start Time 1104    PT Stop Time 1144    PT Time Calculation (min) 40 min    Equipment Utilized During Treatment Gait belt    Activity Tolerance Patient tolerated treatment well    Behavior During Therapy WFL for tasks assessed/performed             Past Medical History:  Diagnosis Date   Anxiety    GERD (gastroesophageal reflux disease)    History of gastric ulcer    2004 APPROX.   Seasonal allergies    Urethral stricture    W/ RETENTION   Wears glasses    Past Surgical History:  Procedure Laterality Date   CYSTOSCOPY WITH RETROGRADE URETHROGRAM N/A 05/07/2015   Procedure: CYSTOSCOPY WITH RETROGRADE URETHROGRAM;  Surgeon: Alexis Frock, MD;  Location: [facility];  Service: Urology;  Laterality: N/A;   CYSTOSCOPY WITH URETHRAL DILATATION N/A 05/07/2015   Procedure: CYSTOSCOPY WITH  BALLOON URETHRAL DILATATION;  Surgeon: Alexis Frock, MD;  Location: [facility];  Service: Urology;  Laterality: N/A;   INGUINAL HERNIA REPAIR Right 04/10/2015   Procedure: LAPAROSCOPIC INGUINAL HERNIA REPAIR WITH MESH;  Surgeon: Ralene Ok, MD;  Location: [facility];  Service: General;  Laterality: Right;   INSERTION OF MESH Right 04/10/2015   Procedure: INSERTION OF MESH;  Surgeon: Ralene Ok, MD;  Location: [facility];  Service: General;  Laterality: Right;   KNEE ARTHROSCOPY Left    There are no problems to display for this patient.   ONSET DATE: 01/17/2023 (referral date)  REFERRING DIAG:  Diagnosis  R26.81  (ICD-10-CM) - Unsteady gait  M79.604,M79.605 (ICD-10-CM) - Bilateral leg pain    THERAPY DIAG:  Unsteadiness on feet  Dizziness and giddiness  Muscle weakness (generalized)  Repeated falls  Rationale for Evaluation and Treatment: Rehabilitation  SUBJECTIVE:                                                                                                                                                                                             SUBJECTIVE STATEMENT: Patient reports doing well. Denies falls/near falls. Has  been walking a lot at work without issue.   PERTINENT HISTORY:  MRI of the cervical spine from 02/09/18 revealed advanced degenerative disc and facet disease but also without cord compression. Underwent labs for neuropathy.   Labs from September  2022 include CK 136, B12 >1550, and TSH 2.16.  NCV-EMG of lower extremities on 09/14/2021 was normal.  MRI of lumbar spine with and without contrast on 10/13/2021 howed degenerative spine changes similar to 2018 but with discogenic marrow edema at T11-12, L2-3 and L5-S1.  PAIN:  Are you having pain? No  PRECAUTIONS: Fall  PATIENT GOALS: "To get back to being steady on my feet."   TODAY'S TREATMENT:                                                                                                                             NMR:  -limits of stability x2 on Neurocom with much improved posterior excursion  -in // bars on inverted Bosu:   -EO, EC, minisquats  -partway up ramp EO, EC, EC + head turns  -partway up ramp toe taps  -increased LOB posteriorly and with L SLS   PATIENT EDUCATION: Education details: continue HEP Person educated: Patient Education method: Explanation and Verbal cues Education comprehension: verbalized understanding and needs further education  HOME EXERCISE PROGRAM: Balance: Eyes Open - Bilateral (Varied Surfaces)    Stand, feet shoulder width, eyes open. Maintain balance _30___ seconds. Repeat  ___3_ times per set. Do ___3_ sets per session. Do ___5_ sessions per week. Repeat on compliant surface: pillow(s). Repeat with eyes closed.   **must do in a corner with a chair in front of you in case you lose your balance Head Motion: Up and Down    Sitting, slowly move head up with eyes open. Hold position until symptoms subside. Then, move head in opposite direction. Hold position until symptoms subside. Repeat __10__ times per session. Do __3__ sessions per day. GOALS: Goals reviewed with patient? Yes  SHORT TERM GOALS: Target date: 02/17/23  Pt will be IND with initial HEP in order to indicate improved functional mobility and dec fall risk. Baseline: provided Goal status: MET  2.  Pt will improve FGA to >/=23/30 in order to indicate dec fall risk.   Baseline: 20/30; 22/30 Goal status: NOT MET  3.  Pt will maintain gait speed of at least 3.0 ft/sec without evidence of compensation/LE ER, less fatigue in order to indicate improved balance/gait.  Baseline: 2.90ft/s or .19m/s Goal status: NOT MET  4.  Patient will improve SOT score to >/=60 to demonstrate improved balance Baseline: 50; 48 Goal status: NOT MET  5. Patient will improve condition 4 of M-CTSIB to >/= 15s to demonstrate improved balance Baseline: 1.87s; 7s Goal status: NOT MET  6.  Pt will negotiate up/down 4 steps with single rail, up/down ramp and curb and ambulate x 500' outdoors over unlevel paved surfaces and grassy surfaces at S level in order to indicate  improved community mobility.   Baseline: not assessed and therefore not tested Goal status: Deferred   LONG TERM GOALS: Target date: 03/20/23  Pt will be IND with HEP in order to indicate improved functional mobility and dec fall risk. Baseline:  Goal status: INITIAL  2.  Pt will improve FGA to >/=26/30 in order to indicate dec fall risk.   Baseline: 20/30 Goal status: INITIAL  3.  Patient will improve SOT composite score to>/= 70 to demonstrate  improved balance Baseline: 50 Goal status: INITIAL  4.  Pt will negotiate up/down 12 steps with single rail, up/down ramp and curb and ambulate x 1000' outdoors over varying surfaces (paved and grassy) at mod I level in order to indicate improved community mobility.   Baseline:  Goal status: INITIAL    ASSESSMENT:  CLINICAL IMPRESSION: Patient seen for skilled PT session with emphasis on balance retraining. Patient with much improvement in posterior excursion and stability with such. Remains with limited L excursion and reaction time related to chronic L LE injury. Improving ankle strategy noted to maintain balance on incline or with subtle LOB. Continue POC.      OBJECTIVE IMPAIRMENTS: Abnormal gait, decreased balance, decreased mobility, difficulty walking, decreased strength, dizziness, impaired perceived functional ability, impaired sensation, and postural dysfunction.   ACTIVITY LIMITATIONS: stairs, transfers, and locomotion level  PARTICIPATION LIMITATIONS: cleaning, community activity, occupation, and yard work  PERSONAL FACTORS: Age, Time since onset of injury/illness/exacerbation, and 3+ comorbidities: see above  are also affecting patient's functional outcome.   REHAB POTENTIAL: Excellent  CLINICAL DECISION MAKING: Evolving/moderate complexity  EVALUATION COMPLEXITY: Moderate  PLAN:  PT FREQUENCY: 1-2x/week  PT DURATION: 8 weeks  PLANNED INTERVENTIONS: Therapeutic exercises, Therapeutic activity, Neuromuscular re-education, Balance training, Gait training, Patient/Family education, Self Care, Vestibular training, and Canalith repositioning  PLAN FOR NEXT SESSION: Continue vestibular challenges (EC, compliant), work on hip and stepping strategy, posterior LOB recovery/ weight shifts, resisted gait, blaze pods, cone taps on incline   Debbora Dus, PT, DPT, CBIS 03/03/23, 11:48 AM

## 2023-03-09 ENCOUNTER — Ambulatory Visit: Payer: Medicare HMO

## 2023-03-09 DIAGNOSIS — R296 Repeated falls: Secondary | ICD-10-CM

## 2023-03-09 DIAGNOSIS — R42 Dizziness and giddiness: Secondary | ICD-10-CM | POA: Diagnosis not present

## 2023-03-09 DIAGNOSIS — M6281 Muscle weakness (generalized): Secondary | ICD-10-CM | POA: Diagnosis not present

## 2023-03-09 DIAGNOSIS — R2681 Unsteadiness on feet: Secondary | ICD-10-CM | POA: Diagnosis not present

## 2023-03-09 NOTE — Therapy (Signed)
OUTPATIENT PHYSICAL THERAPY NEURO TREATMENT   Patient Name: Douglas Taylor MRN: [id number] DOB:1946-03-30, 77 y.o., male Today's Date: 03/09/2023   PCP: Shon Baton, MD REFERRING PROVIDER: Metta Clines, DO  END OF SESSION:  PT End of Session - 03/09/23 1102     Visit Number 8    Number of Visits 9    Date for PT Re-Evaluation 03/20/23    Authorization Type [employer] (10th visit PN needed)    Progress Note Due on Visit 10    PT Start Time 1101    PT Stop Time 1141    PT Time Calculation (min) 40 min    Activity Tolerance Patient tolerated treatment well    Behavior During Therapy WFL for tasks assessed/performed             Past Medical History:  Diagnosis Date   Anxiety    GERD (gastroesophageal reflux disease)    History of gastric ulcer    2004 APPROX.   Seasonal allergies    Urethral stricture    W/ RETENTION   Wears glasses    Past Surgical History:  Procedure Laterality Date   CYSTOSCOPY WITH RETROGRADE URETHROGRAM N/A 05/07/2015   Procedure: CYSTOSCOPY WITH RETROGRADE URETHROGRAM;  Surgeon: Alexis Frock, MD;  Location: [facility];  Service: Urology;  Laterality: N/A;   CYSTOSCOPY WITH URETHRAL DILATATION N/A 05/07/2015   Procedure: CYSTOSCOPY WITH  BALLOON URETHRAL DILATATION;  Surgeon: Alexis Frock, MD;  Location: [facility];  Service: Urology;  Laterality: N/A;   INGUINAL HERNIA REPAIR Right 04/10/2015   Procedure: LAPAROSCOPIC INGUINAL HERNIA REPAIR WITH MESH;  Surgeon: Ralene Ok, MD;  Location: [facility];  Service: General;  Laterality: Right;   INSERTION OF MESH Right 04/10/2015   Procedure: INSERTION OF MESH;  Surgeon: Ralene Ok, MD;  Location: [facility];  Service: General;  Laterality: Right;   KNEE ARTHROSCOPY Left    There are no problems to display for this patient.   ONSET DATE: 01/17/2023 (referral date)  REFERRING DIAG:  Diagnosis  R26.81 (ICD-10-CM) - Unsteady gait  M79.604,M79.605  (ICD-10-CM) - Bilateral leg pain    THERAPY DIAG:  Unsteadiness on feet  Dizziness and giddiness  Muscle weakness (generalized)  Repeated falls  Rationale for Evaluation and Treatment: Rehabilitation  SUBJECTIVE:                                                                                                                                                                                             SUBJECTIVE STATEMENT: Patient reports doing well. Denies falls/near falls. Work has been hectic, but going well in terms of  balance.   PERTINENT HISTORY:  MRI of the cervical spine from 02/09/18 revealed advanced degenerative disc and facet disease but also without cord compression. Underwent labs for neuropathy.   Labs from September  2022 include CK 136, B12 >1550, and TSH 2.16.  NCV-EMG of lower extremities on 09/14/2021 was normal.  MRI of lumbar spine with and without contrast on 10/13/2021 howed degenerative spine changes similar to 2018 but with discogenic marrow edema at T11-12, L2-3 and L5-S1.  PAIN:  Are you having pain? No  PRECAUTIONS: Fall  PATIENT GOALS: "To get back to being steady on my feet."   TODAY'S TREATMENT:                                                                                                                             NMR:  -seated VOR x1 horizontal/vertical x45s with no increase in symptoms and no eye slippage noted -ABC score: 83% -VOR with grid background normal BOS-> NBOS 2x30s -Hart chart with horizontal head turns normal BOS-> semi tandem with increased instability in semi tandem  -seated ankle AROM coordination on wobble board  -significant difficulty coordinating this movement -seated ankle alphabet B LE   PATIENT EDUCATION: Education details: continue HEP Person educated: Patient Education method: Explanation and Verbal cues Education comprehension: verbalized understanding and needs further education  HOME EXERCISE PROGRAM: Balance: Eyes  Open - Bilateral (Varied Surfaces)    Stand, feet shoulder width, eyes open. Maintain balance _30___ seconds. Repeat ___3_ times per set. Do ___3_ sets per session. Do ___5_ sessions per week. Repeat on compliant surface: pillow(s). Repeat with eyes closed.   **must do in a corner with a chair in front of you in case you lose your balance Head Motion: Up and Down    Sitting, slowly move head up with eyes open. Hold position until symptoms subside. Then, move head in opposite direction. Hold position until symptoms subside. Repeat __10__ times per session. Do __3__ sessions per day. GOALS: Goals reviewed with patient? Yes  SHORT TERM GOALS: Target date: 02/17/23  Pt will be IND with initial HEP in order to indicate improved functional mobility and dec fall risk. Baseline: provided Goal status: MET  2.  Pt will improve FGA to >/=23/30 in order to indicate dec fall risk.   Baseline: 20/30; 22/30 Goal status: NOT MET  3.  Pt will maintain gait speed of at least 3.0 ft/sec without evidence of compensation/LE ER, less fatigue in order to indicate improved balance/gait.  Baseline: 2.57ft/s or .54m/s Goal status: NOT MET  4.  Patient will improve SOT score to >/=60 to demonstrate improved balance Baseline: 50; 48 Goal status: NOT MET  5. Patient will improve condition 4 of M-CTSIB to >/= 15s to demonstrate improved balance Baseline: 1.87s; 7s Goal status: NOT MET  6.  Pt will negotiate up/down 4 steps with single rail, up/down ramp and curb and ambulate x 500' outdoors over unlevel paved surfaces and grassy surfaces  at S level in order to indicate improved community mobility.   Baseline: not assessed and therefore not tested Goal status: Deferred   LONG TERM GOALS: Target date: 03/20/23  Pt will be IND with HEP in order to indicate improved functional mobility and dec fall risk. Baseline:  Goal status: INITIAL  2.  Pt will improve FGA to >/=26/30 in order to indicate dec fall  risk.   Baseline: 20/30 Goal status: INITIAL  3.  Patient will improve SOT composite score to>/= 70 to demonstrate improved balance Baseline: 50 Goal status: INITIAL  4.  Pt will negotiate up/down 12 steps with single rail, up/down ramp and curb and ambulate x 1000' outdoors over varying surfaces (paved and grassy) at mod I level in order to indicate improved community mobility.   Baseline:  Goal status: INITIAL    ASSESSMENT:  CLINICAL IMPRESSION: Patient seen for skilled PT session with emphasis on balance and vestibular retraining. Does demonstrate increased instability with NBOS VOR tasks, but able to utilize ankle and hip strategy to some extent to regain balance. Does struggle with overall ankle coordination and strength, necessary for maintaining and regaining standing balance. Continue POC.      OBJECTIVE IMPAIRMENTS: Abnormal gait, decreased balance, decreased mobility, difficulty walking, decreased strength, dizziness, impaired perceived functional ability, impaired sensation, and postural dysfunction.   ACTIVITY LIMITATIONS: stairs, transfers, and locomotion level  PARTICIPATION LIMITATIONS: cleaning, community activity, occupation, and yard work  PERSONAL FACTORS: Age, Time since onset of injury/illness/exacerbation, and 3+ comorbidities: see above  are also affecting patient's functional outcome.   REHAB POTENTIAL: Excellent  CLINICAL DECISION MAKING: Evolving/moderate complexity  EVALUATION COMPLEXITY: Moderate  PLAN:  PT FREQUENCY: 1-2x/week  PT DURATION: 8 weeks  PLANNED INTERVENTIONS: Therapeutic exercises, Therapeutic activity, Neuromuscular re-education, Balance training, Gait training, Patient/Family education, Self Care, Vestibular training, and Canalith repositioning  PLAN FOR NEXT SESSION: goals and dc   Debbora Dus, PT, DPT, CBIS 03/09/23, 11:56 AM

## 2023-03-16 ENCOUNTER — Ambulatory Visit: Payer: Medicare HMO | Attending: Neurology

## 2023-03-16 DIAGNOSIS — R42 Dizziness and giddiness: Secondary | ICD-10-CM | POA: Diagnosis not present

## 2023-03-16 DIAGNOSIS — R2681 Unsteadiness on feet: Secondary | ICD-10-CM | POA: Diagnosis not present

## 2023-03-16 DIAGNOSIS — M6281 Muscle weakness (generalized): Secondary | ICD-10-CM | POA: Diagnosis not present

## 2023-03-16 DIAGNOSIS — R296 Repeated falls: Secondary | ICD-10-CM | POA: Diagnosis not present

## 2023-03-16 NOTE — Therapy (Signed)
OUTPATIENT PHYSICAL THERAPY NEURO TREATMENT   Patient Name: Douglas Taylor MRN: [id number] DOB:[birth year], 77 y.o., male Today's Date: 03/16/2023   PCP: Shon Baton, MD REFERRING PROVIDER: Metta Clines, DO  END OF SESSION:  PT End of Session - 03/16/23 1103     Visit Number 9    Number of Visits 13   re-cert   Date for PT Re-Evaluation 04/27/23    Authorization Type [employer] (10th visit PN needed)    Progress Note Due on Visit 10    PT Start Time 1101    PT Stop Time 1140    PT Time Calculation (min) 39 min    Equipment Utilized During Treatment Gait belt    Activity Tolerance Patient tolerated treatment well    Behavior During Therapy WFL for tasks assessed/performed             Past Medical History:  Diagnosis Date   Anxiety    GERD (gastroesophageal reflux disease)    History of gastric ulcer    2004 APPROX.   Seasonal allergies    Urethral stricture    W/ RETENTION   Wears glasses    Past Surgical History:  Procedure Laterality Date   CYSTOSCOPY WITH RETROGRADE URETHROGRAM N/A 05/07/2015   Procedure: CYSTOSCOPY WITH RETROGRADE URETHROGRAM;  Surgeon: Alexis Frock, MD;  Location: [facility];  Service: Urology;  Laterality: N/A;   CYSTOSCOPY WITH URETHRAL DILATATION N/A 05/07/2015   Procedure: CYSTOSCOPY WITH  BALLOON URETHRAL DILATATION;  Surgeon: Alexis Frock, MD;  [facility];  Service: Urology;  Laterality: N/A;   INGUINAL HERNIA REPAIR Right 04/10/2015   Procedure: LAPAROSCOPIC INGUINAL HERNIA REPAIR WITH MESH;  Surgeon: Ralene Ok, MD;  Location: [facility];  Service: General;  Laterality: Right;   INSERTION OF MESH Right 04/10/2015   Procedure: INSERTION OF MESH;  Surgeon: Ralene Ok, MD;  Location: [facility];  Service: General;  Laterality: Right;   KNEE ARTHROSCOPY Left    There are no problems to display for this patient.   ONSET DATE: 01/17/2023 (referral date)  REFERRING DIAG:  Diagnosis   R26.81 (ICD-10-CM) - Unsteady gait  M79.604,M79.605 (ICD-10-CM) - Bilateral leg pain    THERAPY DIAG:  Unsteadiness on feet  Dizziness and giddiness  Muscle weakness (generalized)  Repeated falls  Rationale for Evaluation and Treatment: Rehabilitation  SUBJECTIVE:                                                                                                                                                                                             SUBJECTIVE STATEMENT: Patient reports doing well. Denies falls/near  falls. Work has been hectic, but going well in terms of balance.   PERTINENT HISTORY:  MRI of the cervical spine from 02/09/18 revealed advanced degenerative disc and facet disease but also without cord compression. Underwent labs for neuropathy.   Labs from September  2022 include CK 136, B12 >1550, and TSH 2.16.  NCV-EMG of lower extremities on 09/14/2021 was normal.  MRI of lumbar spine with and without contrast on 10/13/2021 howed degenerative spine changes similar to 2018 but with discogenic marrow edema at T11-12, L2-3 and L5-S1.  PAIN:  Are you having pain? No  PRECAUTIONS: Fall  PATIENT GOALS: "To get back to being steady on my feet."   TODAY'S TREATMENT:                                                                                                                             Goal check:  SOT -condition 1: pass 3/3  -condition 2: pass 3/3 -condition 3: pass 1/3 -condition 4: pass 1/3 -condition 5: pass 0/3 -condition 6: pass 0/3  -composite: 36 -vision and vestibular below age matched norms    [facility] PT Assessment - 03/16/23 0001       Functional Gait  Assessment   Gait assessed  Yes    Gait Level Surface Walks 20 ft in less than 7 sec but greater than 5.5 sec, uses assistive device, slower speed, mild gait deviations, or deviates 6-10 in outside of the 12 in walkway width.    Change in Gait Speed Able to change speed, demonstrates mild gait deviations,  deviates 6-10 in outside of the 12 in walkway width, or no gait deviations, unable to achieve a major change in velocity, or uses a change in velocity, or uses an assistive device.    Gait with Horizontal Head Turns Performs head turns smoothly with no change in gait. Deviates no more than 6 in outside 12 in walkway width    Gait with Vertical Head Turns Performs task with slight change in gait velocity (eg, minor disruption to smooth gait path), deviates 6 - 10 in outside 12 in walkway width or uses assistive device    Gait and Pivot Turn Pivot turns safely within 3 sec and stops quickly with no loss of balance.    Step Over Obstacle Is able to step over 2 stacked shoe boxes taped together (9 in total height) without changing gait speed. No evidence of imbalance.    Gait with Narrow Base of Support Ambulates 7-9 steps.    Gait with Eyes Closed Walks 20 ft, uses assistive device, slower speed, mild gait deviations, deviates 6-10 in outside 12 in walkway width. Ambulates 20 ft in less than 9 sec but greater than 7 sec.    Ambulating Backwards Walks 20 ft, uses assistive device, slower speed, mild gait deviations, deviates 6-10 in outside 12 in walkway width.    Steps Alternating feet, no rail.    Total Score 24           -  added to HEP (see below)  PATIENT EDUCATION: Education details: continue HEP, exam results Person educated: Patient Education method: Explanation and Verbal cues Education comprehension: verbalized understanding and needs further education  HOME EXERCISE PROGRAM: Access Code: [email or web address] URL: [email or web address] Date: 03/16/2023 Prepared by: Estevan Ryder  Exercises - Seated Heel Raise  - 1 x daily - 7 x weekly - 3 sets - 10 reps - Towel Scrunches  - 1 x daily - 7 x weekly - 3 sets - 10 reps - Heel Raises with Counter Support  - 1 x daily - 7 x weekly - 3 sets - 10 reps - Single Leg Stance  - 1 x daily - 7 x weekly - 3 sets - 30s hold  Balance: Eyes  Open - Bilateral (Varied Surfaces)    Stand, feet shoulder width, eyes open. Maintain balance _30___ seconds. Repeat ___3_ times per set. Do ___3_ sets per session. Do ___5_ sessions per week. Repeat on compliant surface: pillow(s). Repeat with eyes closed.   **must do in a corner with a chair in front of you in case you lose your balance Head Motion: Up and Down    Sitting, slowly move head up with eyes open. Hold position until symptoms subside. Then, move head in opposite direction. Hold position until symptoms subside. Repeat __10__ times per session. Do __3__ sessions per day. GOALS: Goals reviewed with patient? Yes  SHORT TERM GOALS: Target date: 02/17/23  Pt will be IND with initial HEP in order to indicate improved functional mobility and dec fall risk. Baseline: provided Goal status: MET  2.  Pt will improve FGA to >/=23/30 in order to indicate dec fall risk.   Baseline: 20/30; 22/30 Goal status: NOT MET  3.  Pt will maintain gait speed of at least 3.0 ft/sec without evidence of compensation/LE ER, less fatigue in order to indicate improved balance/gait.  Baseline: 2.79ft/s or .58m/s Goal status: NOT MET  4.  Patient will improve SOT score to >/=60 to demonstrate improved balance Baseline: 50; 48 Goal status: NOT MET  5. Patient will improve condition 4 of M-CTSIB to >/= 15s to demonstrate improved balance Baseline: 1.87s; 7s Goal status: NOT MET  6.  Pt will negotiate up/down 4 steps with single rail, up/down ramp and curb and ambulate x 500' outdoors over unlevel paved surfaces and grassy surfaces at S level in order to indicate improved community mobility.   Baseline: not assessed and therefore not tested Goal status: Deferred   LONG TERM GOALS: Target date: 03/20/23  Pt will be IND with HEP in order to indicate improved functional mobility and dec fall risk. Baseline: provided Goal status: MET  2.  Pt will improve FGA to >/=26/30 in order to indicate dec  fall risk.   Baseline: 20/30; 24/30 Goal status: IN PROGRESS  3.  Patient will improve SOT composite score to>/= 70 to demonstrate improved balance Baseline: 50; 36 (has sinus issues going on) Goal status: IN PROGRESS  4.  Pt will negotiate up/down 12 steps with single rail, up/down ramp and curb and ambulate x 1000' outdoors over varying surfaces (paved and grassy) at mod I level in order to indicate improved community mobility.   Baseline: not fully assessed and not able to assess on eval d/t weather Goal status: DISCONTINUED  NEW LONG TERM GOALS: Target date: 04/27/23 1.Pt will be IND with final HEP in order to indicate improved functional mobility and dec fall risk. Baseline: provided Goal status: NEW  2.  Pt will  improve FGA to >/=26/30 in order to indicate dec fall risk.   Baseline: 20/30; 24/30 Goal status: IN PROGRESS  3.  Patient will improve SOT composite score to>/= 70 to demonstrate improved balance Baseline: 50; 36 (has sinus issues going on) Goal status: IN PROGRESS   ASSESSMENT:  CLINICAL IMPRESSION: Patient seen for skilled PT session with emphasis on goal assessment and re-cert. He had a marked decline on his SOT score, but noted current sinus difficulties likely contributing to his imbalance. Patient demonstrates increased fall risk as noted by score of 24/30 on  Functional Gait Assessment.   <22/30 = predictive of falls, <20/30 = fall in 6 months, <18/30 = predictive of falls in PD MCID: 5 points stroke population, 4 points geriatric population (ANPTA Core Set of Outcome Measures for Adults with Neurologic Conditions, 2018). Patient will continue to benefit from skilled PT services to address his vestibular function, balance and balance strategies to prevent future falls. Continue POC.       OBJECTIVE IMPAIRMENTS: Abnormal gait, decreased balance, decreased mobility, difficulty walking, decreased strength, dizziness, impaired perceived functional ability,  impaired sensation, and postural dysfunction.   ACTIVITY LIMITATIONS: stairs, transfers, and locomotion level  PARTICIPATION LIMITATIONS: cleaning, community activity, occupation, and yard work  PERSONAL FACTORS: Age, Time since onset of injury/illness/exacerbation, and 3+ comorbidities: see above  are also affecting patient's functional outcome.   REHAB POTENTIAL: Excellent  CLINICAL DECISION MAKING: Evolving/moderate complexity  EVALUATION COMPLEXITY: Moderate  PLAN:  PT FREQUENCY: 1-2x/week 1x a week  PT DURATION: 8 weeks 4 weeks  PLANNED INTERVENTIONS: Therapeutic exercises, Therapeutic activity, Neuromuscular re-education, Balance training, Gait training, Patient/Family education, Self Care, Vestibular training, and Canalith repositioning  PLAN FOR NEXT SESSION: components of SOT    Debbora Dus, PT, DPT, CBIS 03/16/23, 11:43 AM

## 2023-03-23 ENCOUNTER — Ambulatory Visit: Payer: Medicare HMO | Admitting: Physical Therapy

## 2023-03-23 ENCOUNTER — Encounter: Payer: Self-pay | Admitting: Physical Therapy

## 2023-03-23 VITALS — BP 142/93 | HR 105

## 2023-03-23 DIAGNOSIS — M6281 Muscle weakness (generalized): Secondary | ICD-10-CM

## 2023-03-23 DIAGNOSIS — R296 Repeated falls: Secondary | ICD-10-CM | POA: Diagnosis not present

## 2023-03-23 DIAGNOSIS — R2681 Unsteadiness on feet: Secondary | ICD-10-CM | POA: Diagnosis not present

## 2023-03-23 DIAGNOSIS — R42 Dizziness and giddiness: Secondary | ICD-10-CM

## 2023-03-23 NOTE — Therapy (Signed)
OUTPATIENT PHYSICAL THERAPY NEURO TREATMENT / DISCHARGE   Patient Name: Douglas MaceRobert E Taylor MRN: [id number] DOB:[birth year], 77 y.o., male Today's Date: 03/23/2023   PCP: Creola CornJohn Russo, MD REFERRING PROVIDER: Shon MilletAdam Jaffe, DO  END OF SESSION:  PT End of Session - 03/23/23 1450     Visit Number 10    Number of Visits 13    Date for PT Re-Evaluation 04/27/23    Authorization Type [employer] (10th visit PN needed)    Progress Note Due on Visit 10    PT Start Time 1451    PT Stop Time 1530    PT Time Calculation (min) 39 min    Equipment Utilized During Treatment Gait belt    Activity Tolerance Patient tolerated treatment well    Behavior During Therapy WFL for tasks assessed/performed             Past Medical History:  Diagnosis Date   Anxiety    GERD (gastroesophageal reflux disease)    History of gastric ulcer    2004 APPROX.   Seasonal allergies    Urethral stricture    W/ RETENTION   Wears glasses    Past Surgical History:  Procedure Laterality Date   CYSTOSCOPY WITH RETROGRADE URETHROGRAM N/A 05/07/2015   Procedure: CYSTOSCOPY WITH RETROGRADE URETHROGRAM;  Surgeon: Sebastian Acheheodore Manny, MD;  [facility];  Service: Urology;  Laterality: N/A;   CYSTOSCOPY WITH URETHRAL DILATATION N/A 05/07/2015   Procedure: CYSTOSCOPY WITH  BALLOON URETHRAL DILATATION;  Surgeon: Sebastian Acheheodore Manny, MD;  [facility] [place];  Service: Urology;  Laterality: N/A;   INGUINAL HERNIA REPAIR Right 04/10/2015   Procedure: LAPAROSCOPIC INGUINAL HERNIA REPAIR WITH MESH;  Surgeon: Axel FillerArmando Ramirez, MD;  Location: [facility];  Service: General;  Laterality: Right;   INSERTION OF MESH Right 04/10/2015   Procedure: INSERTION OF MESH;  Surgeon: Axel FillerArmando Ramirez, MD;  Location: [facility];  Service: General;  Laterality: Right;   KNEE ARTHROSCOPY Left    There are no problems to display for this patient.   ONSET DATE: 01/17/2023 (referral date)  REFERRING DIAG:  Diagnosis   R26.81 (ICD-10-CM) - Unsteady gait  M79.604,M79.605 (ICD-10-CM) - Bilateral leg pain    THERAPY DIAG:  Unsteadiness on feet  Dizziness and giddiness  Muscle weakness (generalized)  Repeated falls  Rationale for Evaluation and Treatment: Rehabilitation  SUBJECTIVE:                                                                                                                                                                                             SUBJECTIVE STATEMENT: Patient reports doing well. States that  he is wanting to discharge today due to a busy schedule. Patient denies any falls/near falls. Patient has cataract surgery tomorrow so is wanting to rap up.   PERTINENT HISTORY:   MRI of the cervical spine from 02/09/18 revealed advanced degenerative disc and facet disease but also without cord compression. Underwent labs for neuropathy.   Labs from September  2022 include CK 136, B12 >1550, and TSH 2.16.  NCV-EMG of lower extremities on 09/14/2021 was normal.  MRI of lumbar spine with and without contrast on 10/13/2021 howed degenerative spine changes similar to 2018 but with discogenic marrow edema at T11-12, L2-3 and L5-S1.  PAIN:  Are you having pain? No  PRECAUTIONS: Fall  PATIENT GOALS: "To get back to being steady on my feet."   TODAY'S TREATMENT:                                                                                                                              Vitals:   03/23/23 1453  BP: (!) 142/93  Pulse: (!) 105    Goal check:   Sensory Organization Test Component 1: 1/3 passed (0 falls) Component 2: 1/3 passed (0 falls) Component 3: 0/3 passed (0 falls) Component 4: 2/3 passed (0 falls) Component 5: 0/3 passed (3 falls) Component 6: 0/3 passed (3 falls)  Visual, vestibular, somatosensory below age matched Overall score did not save but < 36    [facility] PT Assessment - 03/23/23 0001       Standardized Balance Assessment   Standardized Balance  Assessment 10 meter walk test    10 Meter Walk 2.87 ft/sec (SBA)      Functional Gait  Assessment   Gait assessed  Yes    Gait Level Surface Walks 20 ft in less than 7 sec but greater than 5.5 sec, uses assistive device, slower speed, mild gait deviations, or deviates 6-10 in outside of the 12 in walkway width.    Change in Gait Speed Able to change speed, demonstrates mild gait deviations, deviates 6-10 in outside of the 12 in walkway width, or no gait deviations, unable to achieve a major change in velocity, or uses a change in velocity, or uses an assistive device.    Gait with Horizontal Head Turns Performs head turns smoothly with slight change in gait velocity (eg, minor disruption to smooth gait path), deviates 6-10 in outside 12 in walkway width, or uses an assistive device.    Gait with Vertical Head Turns Performs head turns with no change in gait. Deviates no more than 6 in outside 12 in walkway width.    Gait and Pivot Turn Pivot turns safely within 3 sec and stops quickly with no loss of balance.    Step Over Obstacle Is able to step over 2 stacked shoe boxes taped together (9 in total height) without changing gait speed. No evidence of imbalance.    Gait with Narrow Base of Support Ambulates 7-9 steps.  Gait with Eyes Closed Walks 20 ft, uses assistive device, slower speed, mild gait deviations, deviates 6-10 in outside 12 in walkway width. Ambulates 20 ft in less than 9 sec but greater than 7 sec.    Ambulating Backwards Walks 20 ft, uses assistive device, slower speed, mild gait deviations, deviates 6-10 in outside 12 in walkway width.    Steps Alternating feet, must use rail.    Total Score 23            PATIENT EDUCATION: Education details: continue HEP, exam results Person educated: Patient Education method: Explanation and Verbal cues Education comprehension: verbalized understanding and needs further education  HOME EXERCISE PROGRAM: Access Code: [email or web address] URL:  [email or web address] Date: 03/16/2023 Prepared by: Merry Lofty  Exercises - Seated Heel Raise  - 1 x daily - 7 x weekly - 3 sets - 10 reps - Towel Scrunches  - 1 x daily - 7 x weekly - 3 sets - 10 reps - Heel Raises with Counter Support  - 1 x daily - 7 x weekly - 3 sets - 10 reps - Single Leg Stance  - 1 x daily - 7 x weekly - 3 sets - 30s hold  Balance: Eyes Open - Bilateral (Varied Surfaces)    Stand, feet shoulder width, eyes open. Maintain balance _30___ seconds. Repeat ___3_ times per set. Do ___3_ sets per session. Do ___5_ sessions per week. Repeat on compliant surface: pillow(s). Repeat with eyes closed.   **must do in a corner with a chair in front of you in case you lose your balance Head Motion: Up and Down    Sitting, slowly move head up with eyes open. Hold position until symptoms subside. Then, move head in opposite direction. Hold position until symptoms subside. Repeat __10__ times per session. Do __3__ sessions per day. GOALS: Goals reviewed with patient? Yes  SHORT TERM GOALS: Target date: 02/17/23  Pt will be IND with initial HEP in order to indicate improved functional mobility and dec fall risk. Baseline: provided Goal status: MET  2.  Pt will improve FGA to >/=23/30 in order to indicate dec fall risk.   Baseline: 20/30; 22/30 Goal status: NOT MET  3.  Pt will maintain gait speed of at least 3.0 ft/sec without evidence of compensation/LE ER, less fatigue in order to indicate improved balance/gait.  Baseline: 2.32ft/s or .84m/s; 1.87 ft/sec (SBA) on 03/23/2023 Goal status: NOT MET  4.  Patient will improve SOT score to >/=60 to demonstrate improved balance Baseline: 50; 48 Goal status: NOT MET  5. Patient will improve condition 4 of M-CTSIB to >/= 15s to demonstrate improved balance Baseline: 1.87s; 7s Goal status: NOT MET  6.  Pt will negotiate up/down 4 steps with single rail, up/down ramp and curb and ambulate x 500' outdoors  over unlevel paved surfaces and grassy surfaces at S level in order to indicate improved community mobility.   Baseline: not assessed and therefore not tested Goal status: Deferred   LONG TERM GOALS: Target date: 03/20/23  Pt will be IND with HEP in order to indicate improved functional mobility and dec fall risk. Baseline: provided; patient reports independence in final HEP Goal status: MET  2.  Pt will improve FGA to >/=26/30 in order to indicate dec fall risk.   Baseline: 20/30; 24/30; 23/30 Goal status: NOT MET  3.  Patient will improve SOT composite score to>/= 70 to demonstrate improved balance Baseline: 50; 36 (has sinus issues going on), < 36 Goal status:  NOT MET  4.  Pt will negotiate up/down 12 steps with single rail, up/down ramp and curb and ambulate x 1000' outdoors over varying surfaces (paved and grassy) at mod I level in order to indicate improved community mobility.   Baseline: not fully assessed and not able to assess on eval d/t weather Goal status: DISCONTINUED  NEW LONG TERM GOALS: Target date: 04/27/23 1.Pt will be IND with final HEP in order to indicate improved functional mobility and dec fall risk. Baseline: provided, reports confidence in final HEP Goal status: MET  2.  Pt will improve FGA to >/=26/30 in order to indicate dec fall risk.   Baseline: 20/30; 24/30; 23/30 (03/22/2022) Goal status: NOT MET  3.  Patient will improve SOT composite score to>/= 70 to demonstrate improved balance Baseline: 50; 36 (has sinus issues going on), < 36 Goal status: NOT MET   ASSESSMENT:  CLINICAL IMPRESSION: Patient is self-discharging from skilled physical therapy services due to a busy schedule and upcoming cataract surgery. Patient reported full confidence in HEP and preferred to spend today checking progress on SOT though just assessed last session. Patient only met one of updated goals from recert which is independence in HEP. Patient regressed both in FGA and  SOT over last week but is overall relatively unchanged on scores. Patient has very little posterior LOB reaction. Recommended patient return to PT with new referral to continue to work on balance when schedule allows. Patient verbalized understanding.     OBJECTIVE IMPAIRMENTS: Abnormal gait, decreased balance, decreased mobility, difficulty walking, decreased strength, dizziness, impaired perceived functional ability, impaired sensation, and postural dysfunction.   ACTIVITY LIMITATIONS: stairs, transfers, and locomotion level  PARTICIPATION LIMITATIONS: cleaning, community activity, occupation, and yard work  PERSONAL FACTORS: Age, Time since onset of injury/illness/exacerbation, and 3+ comorbidities: see above  are also affecting patient's functional outcome.   REHAB POTENTIAL: Excellent  CLINICAL DECISION MAKING: Evolving/moderate complexity  EVALUATION COMPLEXITY: Moderate  PLAN:  PT FREQUENCY: 1-2x/week 1x a week  PT DURATION: 8 weeks 4 weeks  PLANNED INTERVENTIONS: Therapeutic exercises, Therapeutic activity, Neuromuscular re-education, Balance training, Gait training, Patient/Family education, Self Care, Vestibular training, and Canalith repositioning  PLAN FOR NEXT SESSION: not indicated - D/C  Maryruth Eve, PT, DPT 03/23/23, 3:41 PM   PHYSICAL THERAPY DISCHARGE SUMMARY  Visits from Start of Care: 10  Current functional level related to goals / functional outcomes: Increased risk for falls did not meet SOT/FGA goal, independent with HEP   Remaining deficits: Falls risk, little posterior balance reactive strategies   Education / Equipment: Safety at home, return to PT when schedule allows to continue to progress care, continue HEP   Patient agrees to discharge. Patient goals were partially met. Patient is being discharged due to the patient's request.

## 2023-03-24 DIAGNOSIS — H25812 Combined forms of age-related cataract, left eye: Secondary | ICD-10-CM | POA: Diagnosis not present

## 2023-03-30 ENCOUNTER — Ambulatory Visit: Payer: Medicare HMO | Admitting: Physical Therapy

## 2023-04-03 DIAGNOSIS — H35372 Puckering of macula, left eye: Secondary | ICD-10-CM | POA: Diagnosis not present

## 2023-04-03 DIAGNOSIS — Z9889 Other specified postprocedural states: Secondary | ICD-10-CM | POA: Diagnosis not present

## 2023-04-03 DIAGNOSIS — H33191 Other retinoschisis and retinal cysts, right eye: Secondary | ICD-10-CM | POA: Diagnosis not present

## 2023-04-06 ENCOUNTER — Ambulatory Visit: Payer: Medicare HMO

## 2023-04-13 ENCOUNTER — Ambulatory Visit: Payer: Medicare HMO

## 2023-04-17 ENCOUNTER — Telehealth: Payer: Self-pay | Admitting: Anesthesiology

## 2023-04-17 NOTE — Telephone Encounter (Signed)
Pt called stating he started having vertigo last week, for 3 days now. States he takes dramamine 2 x a day. States he would like to know if this dramamine 2 x day will increase his BP and also is there any medication Dr Everlena Cooper can prescribe for him for vertigo.

## 2023-04-18 NOTE — Telephone Encounter (Signed)
Called pt and informed him of results per Dr. Everlena Cooper. He understood and he would call PCP

## 2023-05-03 ENCOUNTER — Telehealth: Payer: Self-pay | Admitting: Neurology

## 2023-05-03 NOTE — Telephone Encounter (Signed)
New message    Patient calling C/o Vertigo today. Is their any medication on the market that would help with vertigo .

## 2023-05-03 NOTE — Telephone Encounter (Signed)
Tried calling patient, No answer. LMOVM to call the office back.   Per Dr.Jaffe,    I have not seen him for vertigo, so I cannot comment.  However, as I previously said in a phone encounter from a couple of weeks ago, vast majority of vertigo is inner ear and not neurologic.  I recommend that he follow up with his PCP

## 2023-05-04 NOTE — Telephone Encounter (Signed)
Patient states that is comes and goes and has for the past year. He states that his PCP told him to reach out to you. He has been told to reach back out to them so they can evaluate first. He has agreed to call them back.

## 2023-05-05 NOTE — Telephone Encounter (Signed)
Patient aware.

## 2023-05-11 DIAGNOSIS — H35372 Puckering of macula, left eye: Secondary | ICD-10-CM | POA: Diagnosis not present

## 2023-05-11 DIAGNOSIS — H33191 Other retinoschisis and retinal cysts, right eye: Secondary | ICD-10-CM | POA: Diagnosis not present

## 2023-06-09 DIAGNOSIS — M25462 Effusion, left knee: Secondary | ICD-10-CM | POA: Diagnosis not present

## 2023-06-09 DIAGNOSIS — R600 Localized edema: Secondary | ICD-10-CM | POA: Diagnosis not present

## 2023-06-09 DIAGNOSIS — M25562 Pain in left knee: Secondary | ICD-10-CM | POA: Diagnosis not present

## 2023-06-20 DIAGNOSIS — Z87898 Personal history of other specified conditions: Secondary | ICD-10-CM | POA: Diagnosis not present

## 2023-06-20 DIAGNOSIS — H6123 Impacted cerumen, bilateral: Secondary | ICD-10-CM | POA: Diagnosis not present

## 2023-07-05 DIAGNOSIS — H35372 Puckering of macula, left eye: Secondary | ICD-10-CM | POA: Diagnosis not present

## 2023-07-12 DIAGNOSIS — H33191 Other retinoschisis and retinal cysts, right eye: Secondary | ICD-10-CM | POA: Diagnosis not present

## 2023-07-12 DIAGNOSIS — H35372 Puckering of macula, left eye: Secondary | ICD-10-CM | POA: Diagnosis not present

## 2023-08-29 DIAGNOSIS — E785 Hyperlipidemia, unspecified: Secondary | ICD-10-CM | POA: Diagnosis not present

## 2023-08-29 DIAGNOSIS — R7989 Other specified abnormal findings of blood chemistry: Secondary | ICD-10-CM | POA: Diagnosis not present

## 2023-08-29 DIAGNOSIS — I10 Essential (primary) hypertension: Secondary | ICD-10-CM | POA: Diagnosis not present

## 2023-08-29 DIAGNOSIS — Z1212 Encounter for screening for malignant neoplasm of rectum: Secondary | ICD-10-CM | POA: Diagnosis not present

## 2023-08-29 DIAGNOSIS — Z125 Encounter for screening for malignant neoplasm of prostate: Secondary | ICD-10-CM | POA: Diagnosis not present

## 2023-08-29 DIAGNOSIS — E875 Hyperkalemia: Secondary | ICD-10-CM | POA: Diagnosis not present

## 2023-08-29 DIAGNOSIS — R739 Hyperglycemia, unspecified: Secondary | ICD-10-CM | POA: Diagnosis not present

## 2023-09-05 DIAGNOSIS — F419 Anxiety disorder, unspecified: Secondary | ICD-10-CM | POA: Diagnosis not present

## 2023-09-05 DIAGNOSIS — M1712 Unilateral primary osteoarthritis, left knee: Secondary | ICD-10-CM | POA: Diagnosis not present

## 2023-09-05 DIAGNOSIS — Z23 Encounter for immunization: Secondary | ICD-10-CM | POA: Diagnosis not present

## 2023-09-05 DIAGNOSIS — R82998 Other abnormal findings in urine: Secondary | ICD-10-CM | POA: Diagnosis not present

## 2023-09-05 DIAGNOSIS — Z1331 Encounter for screening for depression: Secondary | ICD-10-CM | POA: Diagnosis not present

## 2023-09-05 DIAGNOSIS — E663 Overweight: Secondary | ICD-10-CM | POA: Diagnosis not present

## 2023-09-05 DIAGNOSIS — M79606 Pain in leg, unspecified: Secondary | ICD-10-CM | POA: Diagnosis not present

## 2023-09-05 DIAGNOSIS — S81802D Unspecified open wound, left lower leg, subsequent encounter: Secondary | ICD-10-CM | POA: Diagnosis not present

## 2023-09-05 DIAGNOSIS — F1320 Sedative, hypnotic or anxiolytic dependence, uncomplicated: Secondary | ICD-10-CM | POA: Diagnosis not present

## 2023-09-05 DIAGNOSIS — Z0000 Encounter for general adult medical examination without abnormal findings: Secondary | ICD-10-CM | POA: Diagnosis not present

## 2023-09-05 DIAGNOSIS — I10 Essential (primary) hypertension: Secondary | ICD-10-CM | POA: Diagnosis not present

## 2023-09-05 DIAGNOSIS — E785 Hyperlipidemia, unspecified: Secondary | ICD-10-CM | POA: Diagnosis not present

## 2023-09-15 NOTE — Progress Notes (Unsigned)
NEUROLOGY FOLLOW UP OFFICE NOTE  Douglas Taylor [id number]  Assessment/Plan:   1  Unsteady gait.  May be related to underlying neuropathy.    Gabapentin 100mg  twice daily Continue home balance exercises Follow up with PCP re: BP Follow up 1 year   Subjective:  Douglas Taylor is a 77 year old right-handed male who follows up for bilateral leg pain.   UPDATE: Current medication:  Gabapentin 100mg  three daily  Referred to physical therapy.  It has helped.  Walking has improved but still with some balance problems.  Continues to perform home balance exercises.  He reports episodes of vertigo.  Saw ENT who suspected BPPV but since asymptomatic, nothing to do now.       HISTORY:  In June 2018, he had a left knee scope without complication.  Beginning in September 2018, he has had a progressive pain in both legs.  He reports a deep burning pain.  Initially, it radiated down to the dorsum of both feet but now it only involves the shin, usually from the knee down but occasionally just above the knees.  There is a fairly constant pain that is often dull but he has fluctuations of severity throughout the day.  Sometimes he reports severe knee pain on the right side, sometimes the left side and sometimes both at the same time.  He denies weakness.  He has mild "tinge" in the mid low back but otherwise no significant back pain.  He denies neck pain or pain, numbness, or weakness in the upper extremities.  Due to the pain, his gait is often affected, causing him to hobble.  He reports no change in bowel or bladder function.  He denies any back or neck injury.  He had an MRI of the lumbar spine on 09/28/17, which demonstrated mild lumbar spondylosis with no evidence of neural foraminal or central canal stenosis.  Venous ultrasound for DVT was negative.  B12 was 256, so he was advised to start daily.  Due to hyperreflexia on exam, he had MRI of thoracic spine with and without contrast on  01/22/18 revealed multilevel spondylosis with central disc protrusion at T10-11 and bilateral foraminal narrowing but no cord compression.  An MRI of the cervical spine from 02/09/18 revealed advanced degenerative disc and facet disease but also without cord compression. Underwent labs for neuropathy.   Labs from September  2022 include CK 136, B12 >1550, and TSH 2.16.  NCV-EMG of lower extremities on 09/14/2021 was normal.  MRI of lumbar spine with and without contrast on 10/13/2021 howed degenerative spine changes similar to 2018 but with discogenic marrow edema at T11-12, L2-3 and L5-S1.   He reports history of vertigo, usually lasts a day or two.  In January 2023, he had a recurrence of vertigo which lasted 2 weeks.   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  Afterwards, balance actually improved.    PAST MEDICAL HISTORY: Past Medical History:  Diagnosis Date   Anxiety    GERD (gastroesophageal reflux disease)    History of gastric ulcer    2004 APPROX.   Seasonal allergies    Urethral stricture    W/ RETENTION   Wears glasses     MEDICATIONS: Current Outpatient Medications on File Prior to Visit  Medication Sig Dispense Refill  acetaminophen (TYLENOL) 325 MG tablet Take 650 mg by mouth every 6 (six) hours as needed for moderate pain.     ALPRAZolam (XANAX) 0.5 MG tablet Take 0.75 mg by mouth daily with breakfast.      gabapentin (NEURONTIN) 100 MG capsule TAKE 1 CAPSULE(100 MG) BY MOUTH THREE TIMES DAILY 90 capsule 6   losartan (COZAAR) 25 MG tablet      No current facility-administered medications on file prior to visit.    ALLERGIES: No Known Allergies  FAMILY HISTORY: Family History  Problem Relation Age of Onset   Heart disease Mother    Multiple sclerosis  Sister    Healthy Daughter       Objective:  Blood pressure (!) 152/98, pulse 71, height 5\' 6"  (1.676 m), weight 181 lb 9.6 oz (82.4 kg), SpO2 96%. General: No acute distress.  Patient appears well-groomed.   Head:  Normocephalic/atraumatic Eyes:  Fundi examined but not visualized Neck: supple, no paraspinal tenderness, full range of motion Heart:  Regular rate and rhythm Neurological Exam: alert and oriented to person, place, and time.  Speech fluent and not dysarthric, language intact.  CN II-XII intact. Bulk and tone normal, muscle strength 5/5 throughout.  Sensation to pinprick intact, vibratory sensation reduced in right foot, slightly reduced in left foot.  Deep tendon reflexes 3+  in patellars, otherwise 2+ throughout.  Finger to nose testing intact.  Gait mildly broad-based.  Romberg negative.   Shon Millet, DO  CC: Creola Corn, MD

## 2023-09-18 ENCOUNTER — Encounter: Payer: Self-pay | Admitting: Neurology

## 2023-09-18 ENCOUNTER — Ambulatory Visit: Payer: Medicare HMO | Admitting: Neurology

## 2023-09-18 VITALS — BP 152/98 | HR 71 | Ht 66.0 in | Wt 181.6 lb

## 2023-09-18 DIAGNOSIS — M79605 Pain in left leg: Secondary | ICD-10-CM | POA: Diagnosis not present

## 2023-09-18 DIAGNOSIS — M79604 Pain in right leg: Secondary | ICD-10-CM

## 2023-09-18 DIAGNOSIS — R2681 Unsteadiness on feet: Secondary | ICD-10-CM | POA: Diagnosis not present

## 2023-09-18 MED ORDER — GABAPENTIN 100 MG PO CAPS: 100.0000 mg | ORAL_CAPSULE | Freq: Three times a day (TID) | ORAL | 11 refills | Status: DC

## 2023-09-18 NOTE — Patient Instructions (Signed)
Gabapentin 100mg  three times daily Continue exercises

## 2023-09-25 DIAGNOSIS — Z961 Presence of intraocular lens: Secondary | ICD-10-CM | POA: Diagnosis not present

## 2023-09-25 DIAGNOSIS — H40013 Open angle with borderline findings, low risk, bilateral: Secondary | ICD-10-CM | POA: Diagnosis not present

## 2023-09-25 DIAGNOSIS — H04123 Dry eye syndrome of bilateral lacrimal glands: Secondary | ICD-10-CM | POA: Diagnosis not present

## 2023-10-11 DIAGNOSIS — Z9889 Other specified postprocedural states: Secondary | ICD-10-CM | POA: Diagnosis not present

## 2023-10-11 DIAGNOSIS — H33191 Other retinoschisis and retinal cysts, right eye: Secondary | ICD-10-CM | POA: Diagnosis not present

## 2023-10-11 DIAGNOSIS — H35372 Puckering of macula, left eye: Secondary | ICD-10-CM | POA: Diagnosis not present

## 2023-11-21 DIAGNOSIS — M7501 Adhesive capsulitis of right shoulder: Secondary | ICD-10-CM | POA: Diagnosis not present

## 2024-02-03 DIAGNOSIS — J40 Bronchitis, not specified as acute or chronic: Secondary | ICD-10-CM | POA: Diagnosis not present

## 2024-02-03 DIAGNOSIS — R051 Acute cough: Secondary | ICD-10-CM | POA: Diagnosis not present

## 2024-02-06 DIAGNOSIS — R062 Wheezing: Secondary | ICD-10-CM | POA: Diagnosis not present

## 2024-02-06 DIAGNOSIS — J209 Acute bronchitis, unspecified: Secondary | ICD-10-CM | POA: Diagnosis not present

## 2024-02-29 DIAGNOSIS — H35372 Puckering of macula, left eye: Secondary | ICD-10-CM | POA: Diagnosis not present

## 2024-02-29 DIAGNOSIS — H33191 Other retinoschisis and retinal cysts, right eye: Secondary | ICD-10-CM | POA: Diagnosis not present

## 2024-02-29 DIAGNOSIS — Z961 Presence of intraocular lens: Secondary | ICD-10-CM | POA: Diagnosis not present

## 2024-02-29 DIAGNOSIS — H40013 Open angle with borderline findings, low risk, bilateral: Secondary | ICD-10-CM | POA: Diagnosis not present

## 2024-03-05 DIAGNOSIS — Z860100 Personal history of colon polyps, unspecified: Secondary | ICD-10-CM | POA: Diagnosis not present

## 2024-03-05 DIAGNOSIS — K219 Gastro-esophageal reflux disease without esophagitis: Secondary | ICD-10-CM | POA: Diagnosis not present

## 2024-03-05 DIAGNOSIS — K200 Eosinophilic esophagitis: Secondary | ICD-10-CM | POA: Diagnosis not present

## 2024-03-09 ENCOUNTER — Other Ambulatory Visit: Payer: Self-pay | Admitting: Neurology

## 2024-03-12 ENCOUNTER — Other Ambulatory Visit: Payer: Self-pay

## 2024-03-12 MED ORDER — GABAPENTIN 100 MG PO CAPS: 100.0000 mg | ORAL_CAPSULE | Freq: Three times a day (TID) | ORAL | 4 refills | Status: DC

## 2024-03-12 NOTE — Telephone Encounter (Signed)
 Per patient he needs a refill for Gabapentin 100 mg BID.  After reviewing the chart script went to the [facility] instead of [facility].   New script sent to the [facility].

## 2024-04-16 DIAGNOSIS — E559 Vitamin D deficiency, unspecified: Secondary | ICD-10-CM | POA: Diagnosis not present

## 2024-04-16 DIAGNOSIS — E785 Hyperlipidemia, unspecified: Secondary | ICD-10-CM | POA: Diagnosis not present

## 2024-04-16 DIAGNOSIS — R5383 Other fatigue: Secondary | ICD-10-CM | POA: Diagnosis not present

## 2024-04-16 DIAGNOSIS — I10 Essential (primary) hypertension: Secondary | ICD-10-CM | POA: Diagnosis not present

## 2024-04-16 DIAGNOSIS — R739 Hyperglycemia, unspecified: Secondary | ICD-10-CM | POA: Diagnosis not present

## 2024-06-06 DIAGNOSIS — E782 Mixed hyperlipidemia: Secondary | ICD-10-CM | POA: Diagnosis not present

## 2024-06-06 DIAGNOSIS — Z860100 Personal history of colon polyps, unspecified: Secondary | ICD-10-CM | POA: Diagnosis not present

## 2024-06-06 DIAGNOSIS — K219 Gastro-esophageal reflux disease without esophagitis: Secondary | ICD-10-CM | POA: Diagnosis not present

## 2024-06-06 DIAGNOSIS — I10 Essential (primary) hypertension: Secondary | ICD-10-CM | POA: Diagnosis not present

## 2024-08-29 DIAGNOSIS — H35372 Puckering of macula, left eye: Secondary | ICD-10-CM | POA: Diagnosis not present

## 2024-08-29 DIAGNOSIS — H02834 Dermatochalasis of left upper eyelid: Secondary | ICD-10-CM | POA: Diagnosis not present

## 2024-08-29 DIAGNOSIS — H40013 Open angle with borderline findings, low risk, bilateral: Secondary | ICD-10-CM | POA: Diagnosis not present

## 2024-08-29 DIAGNOSIS — H33191 Other retinoschisis and retinal cysts, right eye: Secondary | ICD-10-CM | POA: Diagnosis not present

## 2024-08-29 DIAGNOSIS — H04123 Dry eye syndrome of bilateral lacrimal glands: Secondary | ICD-10-CM | POA: Diagnosis not present

## 2024-08-29 DIAGNOSIS — Z961 Presence of intraocular lens: Secondary | ICD-10-CM | POA: Diagnosis not present

## 2024-08-29 DIAGNOSIS — H02831 Dermatochalasis of right upper eyelid: Secondary | ICD-10-CM | POA: Diagnosis not present

## 2024-09-09 DIAGNOSIS — Z125 Encounter for screening for malignant neoplasm of prostate: Secondary | ICD-10-CM | POA: Diagnosis not present

## 2024-09-09 DIAGNOSIS — Z0189 Encounter for other specified special examinations: Secondary | ICD-10-CM | POA: Diagnosis not present

## 2024-09-09 DIAGNOSIS — Z1212 Encounter for screening for malignant neoplasm of rectum: Secondary | ICD-10-CM | POA: Diagnosis not present

## 2024-09-09 DIAGNOSIS — I10 Essential (primary) hypertension: Secondary | ICD-10-CM | POA: Diagnosis not present

## 2024-09-09 DIAGNOSIS — E785 Hyperlipidemia, unspecified: Secondary | ICD-10-CM | POA: Diagnosis not present

## 2024-09-09 DIAGNOSIS — E559 Vitamin D deficiency, unspecified: Secondary | ICD-10-CM | POA: Diagnosis not present

## 2024-09-09 DIAGNOSIS — K219 Gastro-esophageal reflux disease without esophagitis: Secondary | ICD-10-CM | POA: Diagnosis not present

## 2024-09-09 DIAGNOSIS — R739 Hyperglycemia, unspecified: Secondary | ICD-10-CM | POA: Diagnosis not present

## 2024-09-16 DIAGNOSIS — F1320 Sedative, hypnotic or anxiolytic dependence, uncomplicated: Secondary | ICD-10-CM | POA: Diagnosis not present

## 2024-09-16 DIAGNOSIS — F419 Anxiety disorder, unspecified: Secondary | ICD-10-CM | POA: Diagnosis not present

## 2024-09-16 DIAGNOSIS — E559 Vitamin D deficiency, unspecified: Secondary | ICD-10-CM | POA: Diagnosis not present

## 2024-09-16 DIAGNOSIS — I10 Essential (primary) hypertension: Secondary | ICD-10-CM | POA: Diagnosis not present

## 2024-09-16 DIAGNOSIS — I454 Nonspecific intraventricular block: Secondary | ICD-10-CM | POA: Diagnosis not present

## 2024-09-16 DIAGNOSIS — E663 Overweight: Secondary | ICD-10-CM | POA: Diagnosis not present

## 2024-09-16 DIAGNOSIS — R5383 Other fatigue: Secondary | ICD-10-CM | POA: Diagnosis not present

## 2024-09-16 DIAGNOSIS — Z0000 Encounter for general adult medical examination without abnormal findings: Secondary | ICD-10-CM | POA: Diagnosis not present

## 2024-09-16 DIAGNOSIS — E785 Hyperlipidemia, unspecified: Secondary | ICD-10-CM | POA: Diagnosis not present

## 2024-09-16 DIAGNOSIS — R82998 Other abnormal findings in urine: Secondary | ICD-10-CM | POA: Diagnosis not present

## 2024-09-16 NOTE — Progress Notes (Unsigned)
 NEUROLOGY FOLLOW UP OFFICE NOTE  [name] [id number]  Assessment/Plan:   Unsteady gait suspect secondary to underlying neuropathy and lumbar radiculopathy.    Increase gabapentin  to 100mg  in morning, 200mg  Continue home balance exercises Follow up 1 year   Subjective:  Douglas Taylor is a 78 year old right-handed male who follows up for bilateral leg pain.   UPDATE: Current medication:  Gabapentin  100mg  three daily  Still works at the [employer] on his feet all day.  When he gets home from work, he has flare up of aching pain in the left shin and lateral lower leg.  Vertigo has been stable.       HISTORY:  In June 2018, he had a left knee scope without complication.  Beginning in September 2018, he has had a progressive pain in both legs.  He reports a deep burning pain.  Initially, it radiated down to the dorsum of both feet but now it only involves the shin, usually from the knee down but occasionally just above the knees.  There is a fairly constant pain that is often dull but he has fluctuations of severity throughout the day.  Sometimes he reports severe knee pain on the right side, sometimes the left side and sometimes both at the same time.  He denies weakness.  He has mild tinge in the mid low back but otherwise no significant back pain.  He denies neck pain or pain, numbness, or weakness in the upper extremities.  Due to the pain, his gait is often affected, causing him to hobble.  He reports no change in bowel or bladder function.  He denies any back or neck injury.  He had an MRI of the lumbar spine on 09/28/17, which demonstrated mild lumbar spondylosis with no evidence of neural foraminal or central canal stenosis.  Venous ultrasound for DVT was negative.  B12 was 256, so he was advised to start 1000mcg daily.  Due to hyperreflexia on exam, he had MRI of thoracic spine with and without contrast on 01/22/18 revealed multilevel spondylosis with central disc protrusion  at T10-11 and bilateral foraminal narrowing but no cord compression.  An MRI of the cervical spine from 02/09/18 revealed advanced degenerative disc and facet disease but also without cord compression. Underwent labs for neuropathy.   Labs from September  2022 include CK 136, B12 >1550, and TSH 2.16.  NCV-EMG of lower extremities on 09/14/2021 was normal.  MRI of lumbar spine with and without contrast on 10/13/2021 howed degenerative spine changes similar to 2018 but with discogenic marrow edema at T11-12, L2-3 and L5-S1.   He reports history of vertigo, usually lasts a day or two.  In January 2023, he had a recurrence of vertigo which lasted 2 weeks.   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  Afterwards, balance actually improved.    PAST MEDICAL HISTORY: Past Medical History:  Diagnosis Date   Anxiety    GERD (gastroesophageal reflux disease)    History of gastric ulcer    2004 APPROX.   Seasonal allergies    Urethral stricture    W/ RETENTION   Wears glasses     MEDICATIONS: Current Outpatient Medications on File Prior to Visit  Medication Sig Dispense Refill   acetaminophen  (TYLENOL ) 325 MG tablet Take 650 mg by mouth  every 6 (six) hours as needed for moderate pain.     ALPRAZolam (XANAX) 0.5 MG tablet Take 0.75 mg by mouth daily with breakfast.      famotidine (PEPCID) 40 MG tablet Take 40 mg by mouth 2 (two) times daily.     gabapentin  (NEURONTIN ) 100 MG capsule Take 1 capsule (100 mg total) by mouth 3 (three) times daily. 90 capsule 4   losartan (COZAAR) 25 MG tablet      omeprazole (PRILOSEC) 40 MG capsule Take 40 mg by mouth every morning.     No current facility-administered medications on file prior to visit.    ALLERGIES: No Known Allergies  FAMILY  HISTORY: Family History  Problem Relation Age of Onset   Heart disease Mother    Multiple sclerosis Sister    Healthy Daughter       Objective:  Blood pressure 134/88, pulse 86, height 5' 5 (1.651 m), weight 181 lb (82.1 kg), SpO2 94%. General: No acute distress.  Patient appears well-groomed.   Head:  Normocephalic/atraumatic Eyes:  Fundi examined but not visualized Neck: supple, no paraspinal tenderness, full range of motion Heart:  Regular rate and rhythm Neurological Exam: alert and oriented.  Speech fluent and not dysarthric, language intact.  CN II-XII intact. Bulk and tone normal, muscle strength 5/5 throughout.  Sensation to pinprick intact, vibratory sensation reduced in right foot, slightly reduced in left foot.  Deep tendon reflexes 3+ in patellars, otherwise 2+ throughout,  toes downgoing.  Finger to nose testing intact.  Mildly wide-based gait.  Romberg negative.   Juliene Dunnings, DO  CC: Norleen Jungling, MD

## 2024-09-17 ENCOUNTER — Encounter: Payer: Self-pay | Admitting: Neurology

## 2024-09-17 ENCOUNTER — Ambulatory Visit (INDEPENDENT_AMBULATORY_CARE_PROVIDER_SITE_OTHER): Payer: Medicare HMO | Admitting: Neurology

## 2024-09-17 VITALS — BP 134/88 | HR 86 | Ht 65.0 in | Wt 181.0 lb

## 2024-09-17 DIAGNOSIS — M79605 Pain in left leg: Secondary | ICD-10-CM | POA: Diagnosis not present

## 2024-09-17 DIAGNOSIS — M79604 Pain in right leg: Secondary | ICD-10-CM | POA: Diagnosis not present

## 2024-09-17 MED ORDER — GABAPENTIN 100 MG PO CAPS: ORAL_CAPSULE | ORAL | 5 refills | Status: AC

## 2024-09-17 NOTE — Patient Instructions (Signed)
 Increase gabapentin  to 1 pill in morning, 2 pills in afternoon and 2 pills at bedtime

## 2025-09-22 ENCOUNTER — Ambulatory Visit: Admitting: Neurology

## 2025-09-25 ENCOUNTER — Ambulatory Visit: Admitting: Neurology
